# Patient Record
Sex: Female | Born: 1975 | Race: Black or African American | Hispanic: No | Marital: Single | Smoking: Current every day smoker
Health system: Southern US, Community
[De-identification: ages and names within clinical notes are randomized; demographics above are authoritative.]

## PROBLEM LIST (undated history)

## (undated) DIAGNOSIS — B999 Unspecified infectious disease: Secondary | ICD-10-CM

## (undated) DIAGNOSIS — K859 Acute pancreatitis without necrosis or infection, unspecified: Secondary | ICD-10-CM

## (undated) DIAGNOSIS — F329 Major depressive disorder, single episode, unspecified: Secondary | ICD-10-CM

## (undated) DIAGNOSIS — F32A Depression, unspecified: Secondary | ICD-10-CM

## (undated) DIAGNOSIS — A749 Chlamydial infection, unspecified: Secondary | ICD-10-CM

## (undated) HISTORY — PX: INDUCED ABORTION: SHX677

## (undated) HISTORY — PX: DILATION AND CURETTAGE OF UTERUS: SHX78

---

## 2004-05-13 ENCOUNTER — Emergency Department (HOSPITAL_COMMUNITY): Admission: EM | Admit: 2004-05-13 | Discharge: 2004-05-13 | Payer: Self-pay | Admitting: Emergency Medicine

## 2004-05-15 ENCOUNTER — Ambulatory Visit (HOSPITAL_COMMUNITY): Admission: RE | Admit: 2004-05-15 | Discharge: 2004-05-15 | Payer: Self-pay | Admitting: *Deleted

## 2004-07-30 ENCOUNTER — Ambulatory Visit (HOSPITAL_COMMUNITY): Admission: RE | Admit: 2004-07-30 | Discharge: 2004-07-30 | Payer: Self-pay | Admitting: *Deleted

## 2004-09-19 ENCOUNTER — Inpatient Hospital Stay (HOSPITAL_COMMUNITY): Admission: AD | Admit: 2004-09-19 | Discharge: 2004-09-19 | Payer: Self-pay | Admitting: *Deleted

## 2004-09-24 ENCOUNTER — Ambulatory Visit (HOSPITAL_COMMUNITY): Admission: RE | Admit: 2004-09-24 | Discharge: 2004-09-24 | Payer: Self-pay | Admitting: *Deleted

## 2004-09-27 ENCOUNTER — Ambulatory Visit: Payer: Self-pay | Admitting: Family Medicine

## 2004-09-27 ENCOUNTER — Inpatient Hospital Stay (HOSPITAL_COMMUNITY): Admission: AD | Admit: 2004-09-27 | Discharge: 2004-10-01 | Payer: Self-pay | Admitting: Obstetrics & Gynecology

## 2004-10-04 ENCOUNTER — Inpatient Hospital Stay (HOSPITAL_COMMUNITY): Admission: AD | Admit: 2004-10-04 | Discharge: 2004-10-04 | Payer: Self-pay | Admitting: Obstetrics and Gynecology

## 2004-10-27 ENCOUNTER — Ambulatory Visit: Payer: Self-pay | Admitting: Obstetrics & Gynecology

## 2005-09-13 ENCOUNTER — Emergency Department (HOSPITAL_COMMUNITY): Admission: EM | Admit: 2005-09-13 | Discharge: 2005-09-13 | Payer: Self-pay | Admitting: Emergency Medicine

## 2006-04-22 ENCOUNTER — Emergency Department (HOSPITAL_COMMUNITY): Admission: EM | Admit: 2006-04-22 | Discharge: 2006-04-22 | Payer: Self-pay

## 2013-03-30 ENCOUNTER — Emergency Department (HOSPITAL_COMMUNITY): Payer: Medicaid Other

## 2013-03-30 ENCOUNTER — Encounter (HOSPITAL_COMMUNITY): Payer: Self-pay | Admitting: Physical Medicine and Rehabilitation

## 2013-03-30 ENCOUNTER — Inpatient Hospital Stay (HOSPITAL_COMMUNITY)
Admission: EM | Admit: 2013-03-30 | Discharge: 2013-04-01 | DRG: 417 | Disposition: A | Discharge status: Home / Self Care | Payer: Medicaid Other | Attending: Internal Medicine | Admitting: Internal Medicine

## 2013-03-30 DIAGNOSIS — Z720 Tobacco use: Secondary | ICD-10-CM | POA: Diagnosis present

## 2013-03-30 DIAGNOSIS — K8000 Calculus of gallbladder with acute cholecystitis without obstruction: Principal | ICD-10-CM | POA: Diagnosis present

## 2013-03-30 DIAGNOSIS — E876 Hypokalemia: Secondary | ICD-10-CM | POA: Diagnosis not present

## 2013-03-30 DIAGNOSIS — F17200 Nicotine dependence, unspecified, uncomplicated: Secondary | ICD-10-CM | POA: Diagnosis present

## 2013-03-30 DIAGNOSIS — K859 Acute pancreatitis without necrosis or infection, unspecified: Secondary | ICD-10-CM

## 2013-03-30 DIAGNOSIS — Z6841 Body Mass Index (BMI) 40.0 and over, adult: Secondary | ICD-10-CM

## 2013-03-30 DIAGNOSIS — K8020 Calculus of gallbladder without cholecystitis without obstruction: Secondary | ICD-10-CM | POA: Diagnosis present

## 2013-03-30 DIAGNOSIS — Z8719 Personal history of other diseases of the digestive system: Secondary | ICD-10-CM | POA: Diagnosis present

## 2013-03-30 DIAGNOSIS — E669 Obesity, unspecified: Secondary | ICD-10-CM | POA: Diagnosis present

## 2013-03-30 HISTORY — DX: Acute pancreatitis without necrosis or infection, unspecified: K85.90

## 2013-03-30 LAB — CBC
Hemoglobin: 11.7 g/dL — ABNORMAL LOW (ref 12.0–15.0)
Platelets: 242 10*3/uL (ref 150–400)
RBC: 3.8 MIL/uL — ABNORMAL LOW (ref 3.87–5.11)

## 2013-03-30 LAB — CREATININE, SERUM
Creatinine, Ser: 0.62 mg/dL (ref 0.50–1.10)
GFR calc non Af Amer: >90 mL/min (ref >90)

## 2013-03-30 LAB — COMPREHENSIVE METABOLIC PANEL
AST: 18 U/L (ref 0–37)
BUN: 12 mg/dL (ref 6–23)
CO2: 23 mEq/L (ref 19–32)
Calcium: 8.9 mg/dL (ref 8.4–10.5)
Chloride: 104 mEq/L (ref 96–112)
Creatinine, Ser: 0.74 mg/dL (ref 0.50–1.10)
GFR calc Af Amer: >90 mL/min (ref >90)
GFR calc non Af Amer: >90 mL/min (ref >90)
Glucose, Bld: 92 mg/dL (ref 70–99)
Potassium: 3.9 mEq/L (ref 3.5–5.1)
Total Bilirubin: 0.2 mg/dL — ABNORMAL LOW (ref 0.3–1.2)

## 2013-03-30 LAB — URINALYSIS, ROUTINE W REFLEX MICROSCOPIC
Bilirubin Urine: NEGATIVE
Glucose, UA: NEGATIVE mg/dL
Ketones, ur: NEGATIVE mg/dL
Leukocytes, UA: NEGATIVE
Nitrite: NEGATIVE
Protein, ur: NEGATIVE mg/dL
Specific Gravity, Urine: 1.019 (ref 1.005–1.030)
Urobilinogen, UA: 0.2 mg/dL (ref 0.0–1.0)

## 2013-03-30 LAB — URINE MICROSCOPIC-ADD ON

## 2013-03-30 LAB — CBC WITH DIFFERENTIAL/PLATELET
Basophils Absolute: 0 10*3/uL (ref 0.0–0.1)
Basophils Relative: 0 % (ref 0–1)
Eosinophils Absolute: 0.1 10*3/uL (ref 0.0–0.7)
Eosinophils Relative: 1 % (ref 0–5)
HCT: 39 % (ref 36.0–46.0)
Hemoglobin: 13 g/dL (ref 12.0–15.0)
Lymphocytes Relative: 20 % (ref 12–46)
Lymphs Abs: 2 10*3/uL (ref 0.7–4.0)
MCH: 31.6 pg (ref 26.0–34.0)
MCHC: 33.3 g/dL (ref 30.0–36.0)
MCV: 94.9 fL (ref 78.0–100.0)
Monocytes Absolute: 0.4 10*3/uL (ref 0.1–1.0)
Monocytes Relative: 4 % (ref 3–12)
Neutro Abs: 7.4 10*3/uL (ref 1.7–7.7)
Neutrophils Relative %: 74 % (ref 43–77)
WBC: 10 10*3/uL (ref 4.0–10.5)

## 2013-03-30 LAB — PREGNANCY, URINE: Preg Test, Ur: NEGATIVE

## 2013-03-30 LAB — LIPASE, BLOOD: Lipase: 179 U/L — ABNORMAL HIGH (ref 11–59)

## 2013-03-30 LAB — POCT I-STAT TROPONIN I: Troponin i, poc: 0 ng/mL (ref 0.00–0.08)

## 2013-03-30 MED ORDER — ACETAMINOPHEN 650 MG RE SUPP: 650.0000 mg | Freq: Four times a day (QID) | RECTAL | Status: DC | PRN

## 2013-03-30 MED ORDER — MORPHINE SULFATE 4 MG/ML IJ SOLN
6.0000 mg | Freq: Once | INTRAMUSCULAR | Status: AC
  Administered 2013-03-30: 6 mg via INTRAVENOUS
  Filled 2013-03-30: qty 2

## 2013-03-30 MED ORDER — ACETAMINOPHEN 325 MG PO TABS: 650.0000 mg | ORAL_TABLET | Freq: Four times a day (QID) | ORAL | Status: DC | PRN

## 2013-03-30 MED ORDER — SODIUM CHLORIDE 0.9 % IV BOLUS (SEPSIS)
1000.0000 mL | Freq: Once | INTRAVENOUS | Status: AC
  Administered 2013-03-30: 1000 mL via INTRAVENOUS

## 2013-03-30 MED ORDER — SODIUM CHLORIDE 0.9 % IV SOLN
INTRAVENOUS | Status: DC
  Administered 2013-03-31 (×2): via INTRAVENOUS

## 2013-03-30 MED ORDER — SODIUM CHLORIDE 0.9 % IV SOLN
INTRAVENOUS | Status: DC
  Administered 2013-03-30: 18:00:00 via INTRAVENOUS

## 2013-03-30 MED ORDER — NICOTINE 7 MG/24HR TD PT24
7.0000 mg | MEDICATED_PATCH | Freq: Every day | TRANSDERMAL | Status: DC
  Filled 2013-03-30 (×3): qty 1

## 2013-03-30 MED ORDER — ONDANSETRON HCL 4 MG/2ML IJ SOLN
4.0000 mg | INTRAMUSCULAR | Status: DC
  Administered 2013-03-30: 4 mg via INTRAVENOUS
  Filled 2013-03-30: qty 2

## 2013-03-30 MED ORDER — PANTOPRAZOLE SODIUM 40 MG IV SOLR
40.0000 mg | INTRAVENOUS | Status: DC
  Administered 2013-03-30 – 2013-03-31 (×2): 40 mg via INTRAVENOUS
  Filled 2013-03-30 (×3): qty 40

## 2013-03-30 MED ORDER — ONDANSETRON HCL 4 MG/2ML IJ SOLN
4.0000 mg | Freq: Once | INTRAMUSCULAR | Status: AC
  Administered 2013-03-30: 4 mg via INTRAVENOUS
  Filled 2013-03-30: qty 2

## 2013-03-30 MED ORDER — SENNOSIDES-DOCUSATE SODIUM 8.6-50 MG PO TABS: 1.0000 | ORAL_TABLET | Freq: Every evening | ORAL | Status: DC | PRN

## 2013-03-30 MED ORDER — HYDROMORPHONE HCL PF 1 MG/ML IJ SOLN
1.0000 mg | INTRAMUSCULAR | Status: DC | PRN
  Administered 2013-03-30 – 2013-03-31 (×6): 2 mg via INTRAVENOUS
  Administered 2013-03-31 (×2): 1 mg via INTRAVENOUS
  Administered 2013-03-31: 2 mg via INTRAVENOUS
  Filled 2013-03-30 (×3): qty 2
  Filled 2013-03-30: qty 1
  Filled 2013-03-30 (×3): qty 2
  Filled 2013-03-30: qty 1
  Filled 2013-03-30: qty 2

## 2013-03-30 MED ORDER — PROMETHAZINE HCL 25 MG/ML IJ SOLN
12.5000 mg | Freq: Four times a day (QID) | INTRAMUSCULAR | Status: DC | PRN
  Administered 2013-03-31: 12.5 mg via INTRAVENOUS
  Filled 2013-03-30: qty 1

## 2013-03-30 MED ORDER — HYDROMORPHONE HCL PF 1 MG/ML IJ SOLN
1.0000 mg | INTRAMUSCULAR | Status: DC | PRN
  Administered 2013-03-30: 1 mg via INTRAVENOUS
  Filled 2013-03-30: qty 1

## 2013-03-30 MED ORDER — HEPARIN SODIUM (PORCINE) 5000 UNIT/ML IJ SOLN
5000.0000 [IU] | Freq: Three times a day (TID) | INTRAMUSCULAR | Status: DC
  Administered 2013-03-30 – 2013-04-01 (×5): 5000 [IU] via SUBCUTANEOUS
  Filled 2013-03-30 (×8): qty 1

## 2013-03-30 NOTE — ED Notes (Signed)
unable to urinate in triage; sts "I just went before I came back here"

## 2013-03-30 NOTE — H&P (Addendum)
[facility] History and Physical  Sarah Mueller ZOX:[id number] DOB: 1976/04/16 DOA: 03/30/2013  Referring physician: Rosalia Hammers PCP: No PCP Per Patient   Chief Complaint: Abdominal pain  HPI: Sarah Mueller is a 37 y.o. female who presents with sudden onset epigastric pain this morning. It is very severe. She had an episode of vomiting after taking TUMS this morning. In the emergency room, she was found to have an elevated lipase. Liver function tests were normal. Ultrasound was done and showed cholelithiasis but no cholecystitis or ductal dilatation. Patient reports similar, less severe pain in the past, but usually just rests and it resolves. She drinks alcohol but denies drinking to excess. She had a beer after work last night. No significant past medical history. No fevers or chills.  Review of Systems: Systems reviewed and as above otherwise negative.  Past medical history: None  Past surgical history: C-section  Social History: Patient smokes a pack every 3 days. She drinks a beer after work occasionally. Denies drug use. Here with her boyfriend.  No Known Allergies  Family history: Mother on multiple medications, but patient does not know what health problems she has. Father is healthy.  Prior to Admission medications   Medication Sig Start Date End Date Taking? Authorizing Provider  naproxen sodium (ANAPROX) 220 MG tablet Take 880 mg by mouth daily as needed (Pain).   Yes Historical Provider, MD   Physical Exam: Filed Vitals:   03/30/13 1445 03/30/13 1600 03/30/13 1630 03/30/13 1750  BP: 126/68 123/98 127/73 157/87  Pulse:  68 61 57  Temp:    97.8 F (36.6 C)  TempSrc:    Oral  Resp: 18   20  Height:    5\' 6"  (1.676 m)  Weight:    130.3 kg (287 lb 4.2 oz)  SpO2: 100% 100% 96% 100%   Body mass index is 46.39 kg/(m^2).  BP 157/87  Pulse 57  Temp(Src) 97.8 F (36.6 C) (Oral)  Resp 20  Ht 5\' 6"  (1.676 m)  Wt 130.3 kg (287 lb 4.2 oz)  BMI 46.39 kg/m2  SpO2 100%   LMP 03/30/2013  General Appearance:    uncomfortable appearing obese black female walking back from the bathroom, crying. Oriented.   Head:    Normocephalic, without obvious abnormality, atraumatic  Eyes:    PERRL, conjunctiva/corneas clear, EOM's intact, fundi    benign, both eyes     Nose:   Nares normal, septum midline, mucosa normal, no drainage    or sinus tenderness  Throat:   Lips, mucosa, and tongue normal; teeth and gums normal  Neck:   Supple, symmetrical, trachea midline, no adenopathy;    thyroid:  no enlargement/tenderness/nodules; no carotid   bruit or JVD  Back:     Symmetric, no curvature, ROM normal, no CVA tenderness  Lungs:     Clear to auscultation bilaterally, respirations unlabored  Chest Wall:    No tenderness or deformity   Heart:    Regular rate and rhythm, S1 and S2 normal, no murmur, rub   or gallop     Abdomen:     soft, epigastric and right upper quadrant is tender. obese  Genitalia:   deferred   Rectal:   deferred   Extremities:   Extremities normal, atraumatic, no cyanosis or edema  Pulses:   2+ and symmetric all extremities  Skin:   Skin color, texture, turgor normal, no rashes or lesions  Lymph nodes:   Cervical, supraclavicular, and axillary nodes normal  Neurologic:  CNII-XII intact, normal strength, sensation and reflexes    throughout    Labs on Admission:  Basic Metabolic Panel:  Recent Labs Lab 03/30/13 1239  NA 141  K 3.9  CL 104  CO2 23  GLUCOSE 92  BUN 12  CREATININE 0.74  CALCIUM 8.9   Liver Function Tests:  Recent Labs Lab 03/30/13 1239  AST 18  ALT 21  ALKPHOS 73  BILITOT 0.2*  PROT 7.1  ALBUMIN 3.5    Recent Labs Lab 03/30/13 1239  LIPASE 179*   No results found for this basename: AMMONIA,  in the last 168 hours CBC:  Recent Labs Lab 03/30/13 1239  WBC 10.0  NEUTROABS 7.4  HGB 13.0  HCT 39.0  MCV 94.9  PLT 286   Cardiac Enzymes: No results found for this basename: CKTOTAL, CKMB, CKMBINDEX,  TROPONINI,  in the last 168 hours  BNP (last 3 results) No results found for this basename: PROBNP,  in the last 8760 hours CBG: No results found for this basename: GLUCAP,  in the last 168 hours  Radiological Exams on Admission: Dg Chest 2 View  03/30/2013   *RADIOLOGY REPORT*  Clinical Data: Chest pain  CHEST - 2 VIEW  Comparison: None.  Findings: The lungs are well-aerated and free from pulmonary edema, focal airspace consolidation or pulmonary nodule.  Cardiac and mediastinal contours are within normal limits.  No pneumothorax, or pleural effusion. No acute osseous findings.  IMPRESSION:  No acute cardiopulmonary disease.   Original Report Authenticated By: Malachy Moan, M.D.   US Abdomen Complete  03/30/2013   *RADIOLOGY REPORT*  Clinical Data:  Abdominal pain.  ABDOMEN ULTRASOUND  Technique:  Complete abdominal ultrasound examination was performed including evaluation of the liver, gallbladder, bile ducts, pancreas, kidneys, spleen, IVC, and abdominal aorta.  Comparison:  None  Findings:  Gallbladder:  There is a dominant, shadowing gallstone located in the neck of the gallbladder and likely measuring over 3 cm in greatest diameter.  There is suggestion of also potentially some sludge and small additional calculi in the gallbladder.  There is no evidence of overt gallbladder wall thickening or sonographic Murphy's sign.  Common Bile Duct:  Normal caliber with measured diameter of 4 mm.  Liver:  No focal mass lesion seen.  Within normal limits in parenchymal echogenicity.  IVC:  Patent throughout its visualized course in the abdomen.  Pancreas:  Although the pancreas is difficult to visualize in its entirety, no focal pancreatic abnormality is identified.  Spleen:  The spleen is of normal echotexture and size.  Kidneys:  The right kidney measures 11.8 cm and the left kidney 10.3 cm.  Both have a normal sonographic appearance.  Abdominal Aorta:  Visualized abdominal aorta is of normal caliber.  The distal aorta is not visualized.  IMPRESSION: Cholelithiasis with a large dominant shadowing gallstone identified in the neck of the gallbladder.  No overt associated evidence by ultrasound of cholecystitis or biliary obstruction.   Original Report Authenticated By: Irish Lack, M.D.   Assessment/Plan Principal Problem:   Pancreatitis, acute: Patient denies heavy alcohol use. LFTs are normal, but could be biliary in origin. Will keep patient n.p.o., give IV fluids, pain medication, anti-emetics. Will follow lipase and liver function tests. Should patient is pain fail to improve within the next day or 2, or should her liver function tests rise, may require MRCP or other workup. We'll likely require elective laparoscopic cholecystectomy once pancreatitis resolves.    Obesity, unspecified    Cholelithiasis: See  above. Ultrasound is without signs of cholecystitis or ductal dilatation.    Tobacco abuse: Nicotine patch.  Code Status: Full Family Communication: Discussed with patient's significant other with patient's permission Disposition Plan: Eventually home Time spent: 50 minutes  Jeree Delcid L [facility] Pager [phone]  If 7PM-7AM, please contact night-coverage [email or web address] Password [facility] 03/30/2013, 6:13 PM

## 2013-03-30 NOTE — ED Provider Notes (Signed)
History     CSN: [id number]  Arrival date & time 03/30/13  1230   First MD Initiated Contact with Patient 03/30/13 1346      Chief Complaint  Patient presents with  . Abdominal Pain    (Consider location/radiation/quality/duration/timing/severity/associated sxs/prior treatment) HPI  Sarah Mueller is a 37 y.o. female complaining of acute onset of epigastric pain onset this a.m. rated at 8/10, described as gnawing patient took a TUMS and proceeded to get nauseous and had single episode of nonbloody, nonbilious, no coffee ground emesis. After the emesis patient describes at chest pain. Patient denies fever, shortness of breath, change in bowel or bladder habits.  No primary care physician  No past medical history on file.  No past surgical history on file.  No family history on file.  History  Substance Use Topics  . Smoking status: Current Every Day Smoker    Types: Cigarettes  . Smokeless tobacco: Not on file  . Alcohol Use: Yes     Comment: social    OB History   Grav Para Term Preterm Abortions TAB SAB Ect Mult Living                  Review of Systems  Constitutional: Negative for fever.  Respiratory: Negative for shortness of breath.   Cardiovascular: Negative for chest pain.  Gastrointestinal: Positive for nausea, vomiting and abdominal pain. Negative for diarrhea.  All other systems reviewed and are negative.    Allergies  Review of patient's allergies indicates no known allergies.  Home Medications   Current Outpatient Rx  Name  Route  Sig  Dispense  Refill  . naproxen sodium (ANAPROX) 220 MG tablet   Oral   Take 880 mg by mouth daily as needed (Pain).           BP 159/93  Pulse 62  Temp(Src) 98.6 F (37 C) (Oral)  Resp 18  SpO2 100%  Physical Exam  Nursing note and vitals reviewed. Constitutional: She is oriented to person, place, and time. She appears well-developed and well-nourished. No distress.  HENT:  Head: Normocephalic.   Mouth/Throat: Oropharynx is clear and moist.  Eyes: Conjunctivae and EOM are normal.  Cardiovascular: Normal rate.   Pulmonary/Chest: Effort normal. No stridor.  Abdominal: Soft. Bowel sounds are normal. She exhibits no distension and no mass. There is tenderness. There is no rebound and no guarding.  Positive Murphy sign  Musculoskeletal: Normal range of motion.  Neurological: She is alert and oriented to person, place, and time.  Psychiatric: She has a normal mood and affect.    ED Course  Procedures (including critical care time)  Labs Reviewed  COMPREHENSIVE METABOLIC PANEL - Abnormal; Notable for the following:    Total Bilirubin 0.2 (*)    All other components within normal limits  LIPASE, BLOOD - Abnormal; Notable for the following:    Lipase 179 (*)    All other components within normal limits  URINALYSIS, ROUTINE W REFLEX MICROSCOPIC - Abnormal; Notable for the following:    APPearance CLOUDY (*)    Hgb urine dipstick SMALL (*)    All other components within normal limits  CBC WITH DIFFERENTIAL  URINE MICROSCOPIC-ADD ON  PREGNANCY, URINE  POCT I-STAT TROPONIN I   Dg Chest 2 View  03/30/2013   *RADIOLOGY REPORT*  Clinical Data: Chest pain  CHEST - 2 VIEW  Comparison: None.  Findings: The lungs are well-aerated and free from pulmonary edema, focal airspace consolidation or pulmonary nodule.  Cardiac and mediastinal contours are within normal limits.  No pneumothorax, or pleural effusion. No acute osseous findings.  IMPRESSION:  No acute cardiopulmonary disease.   Original Report Authenticated By: Malachy Moan, M.D.   US Abdomen Complete  03/30/2013   *RADIOLOGY REPORT*  Clinical Data:  Abdominal pain.  ABDOMEN ULTRASOUND  Technique:  Complete abdominal ultrasound examination was performed including evaluation of the liver, gallbladder, bile ducts, pancreas, kidneys, spleen, IVC, and abdominal aorta.  Comparison:  None  Findings:  Gallbladder:  There is a dominant,  shadowing gallstone located in the neck of the gallbladder and likely measuring over 3 cm in greatest diameter.  There is suggestion of also potentially some sludge and small additional calculi in the gallbladder.  There is no evidence of overt gallbladder wall thickening or sonographic Murphy's sign.  Common Bile Duct:  Normal caliber with measured diameter of 4 mm.  Liver:  No focal mass lesion seen.  Within normal limits in parenchymal echogenicity.  IVC:  Patent throughout its visualized course in the abdomen.  Pancreas:  Although the pancreas is difficult to visualize in its entirety, no focal pancreatic abnormality is identified.  Spleen:  The spleen is of normal echotexture and size.  Kidneys:  The right kidney measures 11.8 cm and the left kidney 10.3 cm.  Both have a normal sonographic appearance.  Abdominal Aorta:  Visualized abdominal aorta is of normal caliber. The distal aorta is not visualized.  IMPRESSION: Cholelithiasis with a large dominant shadowing gallstone identified in the neck of the gallbladder.  No overt associated evidence by ultrasound of cholecystitis or biliary obstruction.   Original Report Authenticated By: Irish Lack, M.D.   3:53 PM patient states the morphine initially helped the pain but it has now come back and is quite severe. She's had no episodes of vomiting she is requesting ice chips because her mouth is dry. I told her she can have ice to that she must not swallow any water as it will significantly exacerbate the pain. We have discussed results and the fact that she will require admission for pancreatitis.   Date: 03/30/2013  Rate: 53  Rhythm: Sinus bradycardia  QRS Axis: normal  Intervals: normal  ST/T Wave abnormalities: normal  Conduction Disutrbances:none  Narrative Interpretation:   Old EKG Reviewed: None available    1. Pancreatitis   2. Gallstones       MDM   Filed Vitals:   03/30/13 1235 03/30/13 1400 03/30/13 1443 03/30/13 1445  BP:  159/93 143/77 126/68 126/68  Pulse: 62 52 62   Temp: 98.6 F (37 C)     TempSrc: Oral     Resp: 18  20 18   SpO2: 100% 100% 96% 100%     Sarah Mueller is a 37 y.o. female with epigastric abdominal pain, vomiting. Patient has a positive Murphy sign with no guarding or rebound. Patient is afebrile does not appear septic. Patient also endorses a substernal chest pain described as burning. EKG is nonischemic, troponin  and chest x-ray is negative.   Lipase is elevated at 179. Ultrasound shows cholelithiasis with a large shadowing stone in the neck of the gallbladder with no evidence of overt cholecystitis or obstruction.  Unassigned admission to MedSurg bed under the care of Dr. Lendell Caprice Team 9. Patient will be admitted to inpatient not observation at the request of the admitting physician.  Medications  HYDROmorphone (DILAUDID) injection 1 mg (not administered)  ondansetron (ZOFRAN) injection 4 mg (4 mg Intravenous Given 03/30/13 1556)  morphine 4 MG/ML injection 6 mg (6 mg Intravenous Given 03/30/13 1433)  ondansetron (ZOFRAN) injection 4 mg (4 mg Intravenous Given 03/30/13 1428)  sodium chloride 0.9 % bolus 1,000 mL (1,000 mLs Intravenous New Bag/Given 03/30/13 1549)           Wynetta Emery, PA-C 03/30/13 1623

## 2013-03-30 NOTE — ED Provider Notes (Signed)
History/physical exam/procedure(s) were performed by non-physician practitioner and as supervising physician I was immediately available for consultation/collaboration. I have reviewed all notes and am in agreement with care and plan.   Lexiana Spindel S Elmo Shumard, MD 03/30/13 1723 

## 2013-03-30 NOTE — ED Notes (Signed)
Pt presents to department for evaluation of abdominal pain and frequent belching. States no relief with TUMS this morning. States burning sensation to abdomen. Also states nausea/vomiting. She is conscious alert and oriented x4.

## 2013-03-31 ENCOUNTER — Inpatient Hospital Stay (HOSPITAL_COMMUNITY): Payer: Medicaid Other

## 2013-03-31 ENCOUNTER — Encounter (HOSPITAL_COMMUNITY): Payer: Self-pay | Admitting: Anesthesiology

## 2013-03-31 ENCOUNTER — Encounter (HOSPITAL_COMMUNITY)
Admission: EM | Disposition: A | Discharge status: Home / Self Care | Payer: Self-pay | Source: Home / Self Care | Attending: Internal Medicine

## 2013-03-31 ENCOUNTER — Inpatient Hospital Stay (HOSPITAL_COMMUNITY): Payer: Medicaid Other | Admitting: Anesthesiology

## 2013-03-31 DIAGNOSIS — K8000 Calculus of gallbladder with acute cholecystitis without obstruction: Secondary | ICD-10-CM

## 2013-03-31 HISTORY — PX: CHOLECYSTECTOMY: SHX55

## 2013-03-31 LAB — COMPREHENSIVE METABOLIC PANEL
ALT: 18 U/L (ref 0–35)
Albumin: 3.1 g/dL — ABNORMAL LOW (ref 3.5–5.2)
Alkaline Phosphatase: 62 U/L (ref 39–117)
BUN: 7 mg/dL (ref 6–23)
Calcium: 8.5 mg/dL (ref 8.4–10.5)
Potassium: 3.8 mEq/L (ref 3.5–5.1)
Sodium: 140 mEq/L (ref 135–145)
Total Protein: 6.4 g/dL (ref 6.0–8.3)

## 2013-03-31 LAB — SURGICAL PCR SCREEN: Staphylococcus aureus: NEGATIVE

## 2013-03-31 LAB — LIPASE, BLOOD: Lipase: 15 U/L (ref 11–59)

## 2013-03-31 SURGERY — LAPAROSCOPIC CHOLECYSTECTOMY WITH INTRAOPERATIVE CHOLANGIOGRAM
Anesthesia: General | Site: Abdomen | Wound class: Clean Contaminated

## 2013-03-31 MED ORDER — HYDROMORPHONE HCL PF 1 MG/ML IJ SOLN
INTRAMUSCULAR | Status: AC
  Filled 2013-03-31: qty 1

## 2013-03-31 MED ORDER — NEOSTIGMINE METHYLSULFATE 1 MG/ML IJ SOLN
INTRAMUSCULAR | Status: DC | PRN
  Administered 2013-03-31: 4 mg via INTRAVENOUS

## 2013-03-31 MED ORDER — BUPIVACAINE HCL (PF) 0.25 % IJ SOLN
INTRAMUSCULAR | Status: AC
  Filled 2013-03-31: qty 30

## 2013-03-31 MED ORDER — HYDROMORPHONE HCL PF 1 MG/ML IJ SOLN: 1.0000 mg | INTRAMUSCULAR | Status: DC | PRN

## 2013-03-31 MED ORDER — IOHEXOL 300 MG/ML  SOLN
INTRAMUSCULAR | Status: DC | PRN
  Administered 2013-03-31: 50 mL via INTRAVENOUS

## 2013-03-31 MED ORDER — CEFAZOLIN SODIUM 1 G IJ SOLR
2.0000 g | INTRAMUSCULAR | Status: DC
  Filled 2013-03-31: qty 20

## 2013-03-31 MED ORDER — FENTANYL CITRATE 0.05 MG/ML IJ SOLN
INTRAMUSCULAR | Status: DC | PRN
  Administered 2013-03-31: 50 ug via INTRAVENOUS
  Administered 2013-03-31: 100 ug via INTRAVENOUS
  Administered 2013-03-31: 50 ug via INTRAVENOUS

## 2013-03-31 MED ORDER — OXYCODONE HCL 5 MG/5ML PO SOLN: 5.0000 mg | Freq: Once | ORAL | Status: DC | PRN

## 2013-03-31 MED ORDER — PROPOFOL 10 MG/ML IV BOLUS
INTRAVENOUS | Status: DC | PRN
  Administered 2013-03-31: 200 mg via INTRAVENOUS

## 2013-03-31 MED ORDER — ONDANSETRON HCL 4 MG/2ML IJ SOLN: 4.0000 mg | Freq: Four times a day (QID) | INTRAMUSCULAR | Status: DC | PRN

## 2013-03-31 MED ORDER — ARTIFICIAL TEARS OP OINT
TOPICAL_OINTMENT | OPHTHALMIC | Status: DC | PRN
  Administered 2013-03-31: 1 via OPHTHALMIC

## 2013-03-31 MED ORDER — SODIUM CHLORIDE 0.9 % IR SOLN
Status: DC | PRN
  Administered 2013-03-31: 1000 mL

## 2013-03-31 MED ORDER — ROCURONIUM BROMIDE 100 MG/10ML IV SOLN
INTRAVENOUS | Status: DC | PRN
  Administered 2013-03-31: 40 mg via INTRAVENOUS

## 2013-03-31 MED ORDER — ONDANSETRON HCL 4 MG/2ML IJ SOLN
INTRAMUSCULAR | Status: DC | PRN
  Administered 2013-03-31: 4 mg via INTRAVENOUS

## 2013-03-31 MED ORDER — 0.9 % SODIUM CHLORIDE (POUR BTL) OPTIME
TOPICAL | Status: DC | PRN
  Administered 2013-03-31: 1000 mL

## 2013-03-31 MED ORDER — OXYCODONE-ACETAMINOPHEN 5-325 MG PO TABS
1.0000 | ORAL_TABLET | ORAL | Status: DC | PRN
  Administered 2013-04-01 (×2): 2 via ORAL
  Filled 2013-03-31 (×2): qty 2

## 2013-03-31 MED ORDER — SUCCINYLCHOLINE CHLORIDE 20 MG/ML IJ SOLN
INTRAMUSCULAR | Status: DC | PRN
  Administered 2013-03-31: 120 mg via INTRAVENOUS

## 2013-03-31 MED ORDER — LACTATED RINGERS IV SOLN
INTRAVENOUS | Status: DC | PRN
  Administered 2013-03-31 (×2): via INTRAVENOUS

## 2013-03-31 MED ORDER — DEXTROSE 5 % IV SOLN
INTRAVENOUS | Status: DC | PRN
  Administered 2013-03-31: 15:00:00 via INTRAVENOUS

## 2013-03-31 MED ORDER — LIDOCAINE HCL (CARDIAC) 20 MG/ML IV SOLN
INTRAVENOUS | Status: DC | PRN
  Administered 2013-03-31: 100 mg via INTRAVENOUS

## 2013-03-31 MED ORDER — CEFAZOLIN SODIUM 1-5 GM-% IV SOLN
INTRAVENOUS | Status: DC | PRN
  Administered 2013-03-31: 2 g via INTRAVENOUS

## 2013-03-31 MED ORDER — HYDROMORPHONE HCL PF 1 MG/ML IJ SOLN
0.2500 mg | INTRAMUSCULAR | Status: DC | PRN
  Administered 2013-03-31 (×3): 0.5 mg via INTRAVENOUS

## 2013-03-31 MED ORDER — BUPIVACAINE HCL 0.25 % IJ SOLN
INTRAMUSCULAR | Status: DC | PRN
  Administered 2013-03-31: 30 mL

## 2013-03-31 MED ORDER — CEFAZOLIN SODIUM 1-5 GM-% IV SOLN
INTRAVENOUS | Status: AC
  Filled 2013-03-31: qty 100

## 2013-03-31 MED ORDER — ONDANSETRON HCL 4 MG/2ML IJ SOLN
4.0000 mg | Freq: Four times a day (QID) | INTRAMUSCULAR | Status: DC | PRN
  Filled 2013-03-31: qty 2

## 2013-03-31 MED ORDER — MIDAZOLAM HCL 5 MG/5ML IJ SOLN
INTRAMUSCULAR | Status: DC | PRN
  Administered 2013-03-31: 1 mg via INTRAVENOUS

## 2013-03-31 MED ORDER — OXYCODONE HCL 5 MG PO TABS: 5.0000 mg | ORAL_TABLET | Freq: Once | ORAL | Status: DC | PRN

## 2013-03-31 MED ORDER — ONDANSETRON HCL 4 MG PO TABS
4.0000 mg | ORAL_TABLET | Freq: Four times a day (QID) | ORAL | Status: DC | PRN
  Filled 2013-03-31: qty 1

## 2013-03-31 MED ORDER — LACTATED RINGERS IV SOLN
INTRAVENOUS | Status: DC
  Administered 2013-03-31 (×2): via INTRAVENOUS

## 2013-03-31 MED ORDER — GLYCOPYRROLATE 0.2 MG/ML IJ SOLN
INTRAMUSCULAR | Status: DC | PRN
  Administered 2013-03-31: 0.6 mg via INTRAVENOUS

## 2013-03-31 MED ORDER — CEFAZOLIN SODIUM-DEXTROSE 2-3 GM-% IV SOLR
2.0000 g | INTRAVENOUS | Status: DC
  Filled 2013-03-31: qty 50

## 2013-03-31 SURGICAL SUPPLY — 52 items
APL SKNCLS STERI-STRIP NONHPOA (GAUZE/BANDAGES/DRESSINGS) ×1
APPLIER CLIP 5 13 M/L LIGAMAX5 (MISCELLANEOUS) ×2
APR CLP MED LRG 5 ANG JAW (MISCELLANEOUS) ×1
BAG SPEC RTRVL LRG 6X4 10 (ENDOMECHANICALS)
BENZOIN TINCTURE PRP APPL 2/3 (GAUZE/BANDAGES/DRESSINGS) ×2 IMPLANT
CANISTER SUCTION 2500CC (MISCELLANEOUS) ×2 IMPLANT
CHLORAPREP W/TINT 26ML (MISCELLANEOUS) ×2 IMPLANT
CLIP APPLIE 5 13 M/L LIGAMAX5 (MISCELLANEOUS) ×1 IMPLANT
CLOTH BEACON ORANGE TIMEOUT ST (SAFETY) ×2 IMPLANT
CLSR STERI-STRIP ANTIMIC 1/2X4 (GAUZE/BANDAGES/DRESSINGS) ×1 IMPLANT
COVER MAYO STAND STRL (DRAPES) ×2 IMPLANT
COVER SURGICAL LIGHT HANDLE (MISCELLANEOUS) ×2 IMPLANT
COVER TRANSDUCER ULTRASND (DRAPES) IMPLANT
DEVICE TROCAR PUNCTURE CLOSURE (ENDOMECHANICALS) ×2 IMPLANT
DRAPE C-ARM 42X72 X-RAY (DRAPES) ×2 IMPLANT
DRAPE UTILITY 15X26 W/TAPE STR (DRAPE) ×4 IMPLANT
ELECT REM PT RETURN 9FT ADLT (ELECTROSURGICAL) ×2
ELECTRODE REM PT RTRN 9FT ADLT (ELECTROSURGICAL) ×1 IMPLANT
ENDOLOOP SUT PDS II  0 18 (SUTURE) ×1
ENDOLOOP SUT PDS II 0 18 (SUTURE) IMPLANT
GAUZE SPONGE 2X2 8PLY STRL LF (GAUZE/BANDAGES/DRESSINGS) ×1 IMPLANT
GLOVE BIO SURGEON STRL SZ 6 (GLOVE) ×1 IMPLANT
GLOVE BIO SURGEON STRL SZ7.5 (GLOVE) ×3 IMPLANT
GLOVE BIOGEL PI IND STRL 6 (GLOVE) IMPLANT
GLOVE BIOGEL PI IND STRL 7.5 (GLOVE) IMPLANT
GLOVE BIOGEL PI INDICATOR 6 (GLOVE) ×1
GLOVE BIOGEL PI INDICATOR 7.5 (GLOVE) ×1
GOWN STRL NON-REIN LRG LVL3 (GOWN DISPOSABLE) ×6 IMPLANT
GOWN STRL REIN XL XLG (GOWN DISPOSABLE) ×2 IMPLANT
IV CATH 14GX2 1/4 (CATHETERS) ×2 IMPLANT
KIT BASIN OR (CUSTOM PROCEDURE TRAY) ×2 IMPLANT
KIT ROOM TURNOVER OR (KITS) ×2 IMPLANT
NDL INSUFFLATION 14GA 120MM (NEEDLE) ×1 IMPLANT
NEEDLE INSUFFLATION 14GA 120MM (NEEDLE) ×2 IMPLANT
NS IRRIG 1000ML POUR BTL (IV SOLUTION) ×2 IMPLANT
PAD ARMBOARD 7.5X6 YLW CONV (MISCELLANEOUS) ×4 IMPLANT
POUCH SPECIMEN RETRIEVAL 10MM (ENDOMECHANICALS) IMPLANT
SCISSORS LAP 5X35 DISP (ENDOMECHANICALS) ×2 IMPLANT
SET CHOLANGIOGRAPHY FRANKLIN (SET/KITS/TRAYS/PACK) ×2 IMPLANT
SET IRRIG TUBING LAPAROSCOPIC (IRRIGATION / IRRIGATOR) ×2 IMPLANT
SLEEVE ENDOPATH XCEL 5M (ENDOMECHANICALS) ×2 IMPLANT
SPECIMEN JAR SMALL (MISCELLANEOUS) ×2 IMPLANT
SPONGE GAUZE 2X2 STER 10/PKG (GAUZE/BANDAGES/DRESSINGS) ×1
SUT MNCRL AB 3-0 PS2 18 (SUTURE) ×2 IMPLANT
SUT MNCRL AB 4-0 PS2 18 (SUTURE) ×1 IMPLANT
SUT VICRYL 0 UR6 27IN ABS (SUTURE) ×1 IMPLANT
TAPE CLOTH SURG 4X10 WHT LF (GAUZE/BANDAGES/DRESSINGS) ×1 IMPLANT
TOWEL OR 17X24 6PK STRL BLUE (TOWEL DISPOSABLE) ×2 IMPLANT
TOWEL OR 17X26 10 PK STRL BLUE (TOWEL DISPOSABLE) ×2 IMPLANT
TRAY LAPAROSCOPIC (CUSTOM PROCEDURE TRAY) ×2 IMPLANT
TROCAR XCEL NON-BLD 11X100MML (ENDOMECHANICALS) ×2 IMPLANT
TROCAR XCEL NON-BLD 5MMX100MML (ENDOMECHANICALS) ×2 IMPLANT

## 2013-03-31 NOTE — Progress Notes (Signed)
Dr. Chaney Malling called for a sign out

## 2013-03-31 NOTE — Progress Notes (Addendum)
[facility] PROGRESS NOTE  Dwight Burdo ZOX:[id number] DOB: [birth year] DOA: 03/30/2013 PCP: No PCP Per Patient  Assessment/Plan: Gallstone Pancreatitis, acute:  -Patient denies heavy alcohol use. Even though LFTs are normal,gallstones most likely etiology -lipase has normalized, LFTs nl, I have consulted surgery for possible lap chole -Will keep patient n.p.o., continue IV fluids, pain medication, anti-emetics. Obesity, unspecified  Cholelithiasis: See above. Ultrasound is without signs of cholecystitis or ductal dilatation. - consulted surgery as above for possible lap chole Tobacco abuse: Nicotine patch.   Code Status: full Family Communication: mother and significant other at bedside Disposition Plan: to home when medically ready   Consultants:  Surgery  Procedures:    Antibiotics:  none  HPI/Subjective: States decreased pain  Objective: Filed Vitals:   03/30/13 1630 03/30/13 1750 03/30/13 2219 03/31/13 0546  BP: 127/73 157/87 140/85 124/65  Pulse: 61 57 67 87  Temp:  97.8 F (36.6 C) 97.9 F (36.6 C) 98.2 F (36.8 C)  TempSrc:  Oral Oral Oral  Resp:  20 18 18   Height:  5\' 6"  (1.676 m)    Weight:  130.3 kg (287 lb 4.2 oz)    SpO2: 96% 100% 99% 99%    Intake/Output Summary (Last 24 hours) at 03/31/13 1215 Last data filed at 03/31/13 1040  Gross per 24 hour  Intake 2316.67 ml  Output      0 ml  Net 2316.67 ml   Filed Weights   03/30/13 1750  Weight: 130.3 kg (287 lb 4.2 oz)    Exam:   General:  Alert and oriented x3  Cardiovascular: RRR  Respiratory: CTAB  Abdomen: soft, epigastric tenderness present, ND, no masses palpable  Extremities: No cyanosis and no edema   Data Reviewed: Basic Metabolic Panel:  Recent Labs Lab 03/30/13 1239 03/30/13 1920 03/31/13 0457  NA 141  --  140  K 3.9  --  3.8  CL 104  --  106  CO2 23  --  28  GLUCOSE 92  --  109*  BUN 12  --  7  CREATININE 0.74 0.62 0.75  CALCIUM 8.9  --  8.5   Liver  Function Tests:  Recent Labs Lab 03/30/13 1239 03/31/13 0457  AST 18 13  ALT 21 18  ALKPHOS 73 62  BILITOT 0.2* 0.4  PROT 7.1 6.4  ALBUMIN 3.5 3.1*    Recent Labs Lab 03/30/13 1239 03/31/13 0457  LIPASE 179* 15   No results found for this basename: AMMONIA,  in the last 168 hours CBC:  Recent Labs Lab 03/30/13 1239 03/30/13 1920  WBC 10.0 12.5*  NEUTROABS 7.4  --   HGB 13.0 11.7*  HCT 39.0 35.3*  MCV 94.9 92.9  PLT 286 242   Cardiac Enzymes: No results found for this basename: CKTOTAL, CKMB, CKMBINDEX, TROPONINI,  in the last 168 hours BNP (last 3 results) No results found for this basename: PROBNP,  in the last 8760 hours CBG: No results found for this basename: GLUCAP,  in the last 168 hours  No results found for this or any previous visit (from the past 240 hour(s)).   Studies: Dg Chest 2 View  03/30/2013   *RADIOLOGY REPORT*  Clinical Data: Chest pain  CHEST - 2 VIEW  Comparison: None.  Findings: The lungs are well-aerated and free from pulmonary edema, focal airspace consolidation or pulmonary nodule.  Cardiac and mediastinal contours are within normal limits.  No pneumothorax, or pleural effusion. No acute osseous findings.  IMPRESSION:  No acute  cardiopulmonary disease.   Original Report Authenticated By: Malachy Moan, M.D.   US Abdomen Complete  03/30/2013   *RADIOLOGY REPORT*  Clinical Data:  Abdominal pain.  ABDOMEN ULTRASOUND  Technique:  Complete abdominal ultrasound examination was performed including evaluation of the liver, gallbladder, bile ducts, pancreas, kidneys, spleen, IVC, and abdominal aorta.  Comparison:  None  Findings:  Gallbladder:  There is a dominant, shadowing gallstone located in the neck of the gallbladder and likely measuring over 3 cm in greatest diameter.  There is suggestion of also potentially some sludge and small additional calculi in the gallbladder.  There is no evidence of overt gallbladder wall thickening or sonographic  Murphy's sign.  Common Bile Duct:  Normal caliber with measured diameter of 4 mm.  Liver:  No focal mass lesion seen.  Within normal limits in parenchymal echogenicity.  IVC:  Patent throughout its visualized course in the abdomen.  Pancreas:  Although the pancreas is difficult to visualize in its entirety, no focal pancreatic abnormality is identified.  Spleen:  The spleen is of normal echotexture and size.  Kidneys:  The right kidney measures 11.8 cm and the left kidney 10.3 cm.  Both have a normal sonographic appearance.  Abdominal Aorta:  Visualized abdominal aorta is of normal caliber. The distal aorta is not visualized.  IMPRESSION: Cholelithiasis with a large dominant shadowing gallstone identified in the neck of the gallbladder.  No overt associated evidence by ultrasound of cholecystitis or biliary obstruction.   Original Report Authenticated By: Irish Lack, M.D.    Scheduled Meds: . heparin  5,000 Units Subcutaneous Q8H  . nicotine  7 mg Transdermal Daily  . pantoprazole (PROTONIX) IV  40 mg Intravenous Q24H   Continuous Infusions: . sodium chloride 125 mL/hr at 03/31/13 1025    Principal Problem:   Pancreatitis, acute Active Problems:   Obesity, unspecified   Cholelithiasis   Tobacco abuse    Time spent: 25    [facility]  [facility] Pager [phone]. If 7PM-7AM, please contact night-coverage at [email or web address], password [facility] 03/31/2013, 12:15 PM  LOS: 1 day

## 2013-03-31 NOTE — Progress Notes (Signed)
Brief Nutrition Note:   RD pulled to chart for malnutrition screening tool indicating unintentional weight loss.   Pt states she has lost "about 20 lbs" in several months, related not eating while working and being on her feet all day. Pt states she wanted to lose weight, but was not necessarily trying. Weight loss is not significant in given time frame.  Currently NPO with acute pancreatitis. If pt to remain NPO for >/= 7 days, recommend initiation of nutrition support.  Wt Readings from Last 5 Encounters:  03/30/13 287 lb 4.2 oz (130.3 kg)  Body mass index is 46.39 kg/(m^2). obesity class 3, extreme.  Some weight loss would be beneficial for this pt.   No nutrition interventions warranted at this time. Please consult as needed.   Clarene Duke RD, LDN Pager [phone] After Hours pager [phone]

## 2013-03-31 NOTE — Progress Notes (Signed)
Attempted to void with no sucess

## 2013-03-31 NOTE — Preoperative (Signed)
Beta Blockers   Reason not to administer Beta Blockers:Not Applicable 

## 2013-03-31 NOTE — Anesthesia Procedure Notes (Signed)
Procedure Name: Intubation Date/Time: 03/31/2013 2:36 PM Performed by: Darcey Nora B Pre-anesthesia Checklist: Patient identified, Emergency Drugs available, Suction available and Patient being monitored Patient Re-evaluated:Patient Re-evaluated prior to inductionOxygen Delivery Method: Circle system utilized Preoxygenation: Pre-oxygenation with 100% oxygen Intubation Type: IV induction, Rapid sequence and Cricoid Pressure applied Ventilation: Mask ventilation without difficulty Laryngoscope Size: Mac and 3 Grade View: Grade II Tube type: Oral Tube size: 7.5 mm Number of attempts: 1 (very long jaw) Airway Equipment and Method: Stylet Secured at: 21 (cm at teeth) cm Tube secured with: Tape Dental Injury: Teeth and Oropharynx as per pre-operative assessment

## 2013-03-31 NOTE — Transfer of Care (Signed)
Immediate Anesthesia Transfer of Care Note  Patient: Sarah Mueller  Procedure(s) Performed: Procedure(s): LAPAROSCOPIC CHOLECYSTECTOMY WITH INTRAOPERATIVE CHOLANGIOGRAM (N/A)  Patient Location: PACU  Anesthesia Type:General  Level of Consciousness: awake, alert  and patient cooperative  Airway & Oxygen Therapy: Patient Spontanous Breathing and Patient connected to nasal cannula oxygen  Post-op Assessment: Report given to PACU RN, Post -op Vital signs reviewed and stable, Patient moving all extremities and Patient moving all extremities X 4  Post vital signs: Reviewed and stable  Complications: No apparent anesthesia complications

## 2013-03-31 NOTE — Anesthesia Preprocedure Evaluation (Addendum)
Anesthesia Evaluation  Patient identified by MRN, date of birth, ID band Patient awake    Reviewed: Allergy & Precautions, H&P , NPO status , Patient's Chart, lab work & pertinent test results, reviewed documented beta blocker date and time   History of Anesthesia Complications Negative for: history of anesthetic complications  Airway Mallampati: I TM Distance: >3 FB Neck ROM: Full    Dental  (+) Teeth Intact and Dental Advisory Given   Pulmonary neg pulmonary ROS, Current Smoker,  breath sounds clear to auscultation        Cardiovascular negative cardio ROS  Rhythm:Regular Rate:Normal     Neuro/Psych negative neurological ROS     GI/Hepatic negative GI ROS, Neg liver ROS,   Endo/Other  negative endocrine ROSMorbid obesity  Renal/GU negative Renal ROS     Musculoskeletal   Abdominal   Peds  Hematology negative hematology ROS (+)   Anesthesia Other Findings Plaque on lower gums/teeth.  Long jaw.  Excellent ROM neck and jaw.  Reproductive/Obstetrics                          Anesthesia Physical Anesthesia Plan  ASA: II  Anesthesia Plan: General   Post-op Pain Management:    Induction: Intravenous  Airway Management Planned:   Additional Equipment:   Intra-op Plan:   Post-operative Plan: Extubation in OR  Informed Consent:   Dental advisory given  Plan Discussed with: CRNA and Surgeon  Anesthesia Plan Comments:         Anesthesia Quick Evaluation

## 2013-03-31 NOTE — Consult Note (Signed)
Reason for Consult:Biliary pancreatitis Referring Physician: Dr. Koren Sarah Mueller is an 37 y.o. female.  HPI: Sarah Mueller is a  37 y/o F who c/o 2-3 h/o epigastric abd pain.  She had some accompanied n/v.  Secondary to increasing pain she presented to the ED for eval. She was eval'd in ED and was seen to have signs of pancretitis and US revealed stones.  Lipase returned to normal.  Pain slowly decreasing.    Past Medical History  Diagnosis Date  . Pancreatitis 03/30/2013    Past Surgical History  Procedure Laterality Date  . Cesarean section  2005  . Dilation and curettage of uterus  ~ 2000    History reviewed. No pertinent family history.  Social History:  reports that she has been smoking Cigarettes.  She has a 4.95 pack-year smoking history. She has never used smokeless tobacco. She reports that she drinks about 1.2 ounces of alcohol per week. She reports that she does not use illicit drugs.  Allergies: No Known Allergies  Medications: I have reviewed the patient's current medications.  Results for orders placed during the hospital encounter of 03/30/13 (from the past 48 hour(s))  CBC WITH DIFFERENTIAL     Status: None   Collection Time    03/30/13 12:39 PM      Result Value Range   WBC 10.0  4.0 - 10.5 K/uL   RBC 4.11  3.87 - 5.11 MIL/uL   Hemoglobin 13.0  12.0 - 15.0 g/dL   HCT 16.1  09.6 - 04.5 %   MCV 94.9  78.0 - 100.0 fL   MCH 31.6  26.0 - 34.0 pg   MCHC 33.3  30.0 - 36.0 g/dL   RDW 40.9  81.1 - 91.4 %   Platelets 286  150 - 400 K/uL   Neutrophils Relative % 74  43 - 77 %   Neutro Abs 7.4  1.7 - 7.7 K/uL   Lymphocytes Relative 20  12 - 46 %   Lymphs Abs 2.0  0.7 - 4.0 K/uL   Monocytes Relative 4  3 - 12 %   Monocytes Absolute 0.4  0.1 - 1.0 K/uL   Eosinophils Relative 1  0 - 5 %   Eosinophils Absolute 0.1  0.0 - 0.7 K/uL   Basophils Relative 0  0 - 1 %   Basophils Absolute 0.0  0.0 - 0.1 K/uL  COMPREHENSIVE METABOLIC PANEL     Status: Abnormal   Collection  Time    03/30/13 12:39 PM      Result Value Range   Sodium 141  135 - 145 mEq/L   Potassium 3.9  3.5 - 5.1 mEq/L   Chloride 104  96 - 112 mEq/L   CO2 23  19 - 32 mEq/L   Glucose, Bld 92  70 - 99 mg/dL   BUN 12  6 - 23 mg/dL   Creatinine, Ser 7.82  0.50 - 1.10 mg/dL   Calcium 8.9  8.4 - 95.6 mg/dL   Total Protein 7.1  6.0 - 8.3 g/dL   Albumin 3.5  3.5 - 5.2 g/dL   AST 18  0 - 37 U/L   ALT 21  0 - 35 U/L   Alkaline Phosphatase 73  39 - 117 U/L   Total Bilirubin 0.2 (*) 0.3 - 1.2 mg/dL   GFR calc non Af Amer >90  >90 mL/min   GFR calc Af Amer >90  >90 mL/min   Comment:  The eGFR has been calculated     using the CKD EPI equation.     This calculation has not been     validated in all clinical     situations.     eGFR's persistently     <90 mL/min signify     possible Chronic Kidney Disease.  LIPASE, BLOOD     Status: Abnormal   Collection Time    03/30/13 12:39 PM      Result Value Range   Lipase 179 (*) 11 - 59 U/L  URINALYSIS, ROUTINE W REFLEX MICROSCOPIC     Status: Abnormal   Collection Time    03/30/13  2:13 PM      Result Value Range   Color, Urine YELLOW  YELLOW   APPearance CLOUDY (*) CLEAR   Specific Gravity, Urine 1.019  1.005 - 1.030   pH 8.0  5.0 - 8.0   Glucose, UA NEGATIVE  NEGATIVE mg/dL   Hgb urine dipstick SMALL (*) NEGATIVE   Bilirubin Urine NEGATIVE  NEGATIVE   Ketones, ur NEGATIVE  NEGATIVE mg/dL   Protein, ur NEGATIVE  NEGATIVE mg/dL   Urobilinogen, UA 0.2  0.0 - 1.0 mg/dL   Nitrite NEGATIVE  NEGATIVE   Leukocytes, UA NEGATIVE  NEGATIVE  URINE MICROSCOPIC-ADD ON     Status: None   Collection Time    03/30/13  2:13 PM      Result Value Range   Squamous Epithelial / LPF RARE  RARE   WBC, UA 0-2  <3 WBC/hpf   RBC / HPF 0-2  <3 RBC/hpf   Bacteria, UA RARE  RARE   Urine-Other AMORPHOUS URATES/PHOSPHATES    PREGNANCY, URINE     Status: None   Collection Time    03/30/13  2:13 PM      Result Value Range   Preg Test, Ur NEGATIVE   NEGATIVE   Comment:            THE SENSITIVITY OF THIS     METHODOLOGY IS >20 mIU/mL.  POCT I-STAT TROPONIN I     Status: None   Collection Time    03/30/13  2:39 PM      Result Value Range   Troponin i, poc 0.00  0.00 - 0.08 ng/mL   Comment 3            Comment: Due to the release kinetics of cTnI,     a negative result within the first hours     of the onset of symptoms does not rule out     myocardial infarction with certainty.     If myocardial infarction is still suspected,     repeat the test at appropriate intervals.  CBC     Status: Abnormal   Collection Time    03/30/13  7:20 PM      Result Value Range   WBC 12.5 (*) 4.0 - 10.5 K/uL   RBC 3.80 (*) 3.87 - 5.11 MIL/uL   Hemoglobin 11.7 (*) 12.0 - 15.0 g/dL   HCT 16.1 (*) 09.6 - 04.5 %   MCV 92.9  78.0 - 100.0 fL   MCH 30.8  26.0 - 34.0 pg   MCHC 33.1  30.0 - 36.0 g/dL   RDW 40.9  81.1 - 91.4 %   Platelets 242  150 - 400 K/uL  CREATININE, SERUM     Status: None   Collection Time    03/30/13  7:20 PM      Result Value  Range   Creatinine, Ser 0.62  0.50 - 1.10 mg/dL   GFR calc non Af Amer >90  >90 mL/min   GFR calc Af Amer >90  >90 mL/min   Comment:            The eGFR has been calculated     using the CKD EPI equation.     This calculation has not been     validated in all clinical     situations.     eGFR's persistently     <90 mL/min signify     possible Chronic Kidney Disease.  COMPREHENSIVE METABOLIC PANEL     Status: Abnormal   Collection Time    03/31/13  4:57 AM      Result Value Range   Sodium 140  135 - 145 mEq/L   Potassium 3.8  3.5 - 5.1 mEq/L   Chloride 106  96 - 112 mEq/L   CO2 28  19 - 32 mEq/L   Glucose, Bld 109 (*) 70 - 99 mg/dL   BUN 7  6 - 23 mg/dL   Creatinine, Ser 1.61  0.50 - 1.10 mg/dL   Calcium 8.5  8.4 - 09.6 mg/dL   Total Protein 6.4  6.0 - 8.3 g/dL   Albumin 3.1 (*) 3.5 - 5.2 g/dL   AST 13  0 - 37 U/L   ALT 18  0 - 35 U/L   Alkaline Phosphatase 62  39 - 117 U/L   Total  Bilirubin 0.4  0.3 - 1.2 mg/dL   GFR calc non Af Amer >90  >90 mL/min   GFR calc Af Amer >90  >90 mL/min   Comment:            The eGFR has been calculated     using the CKD EPI equation.     This calculation has not been     validated in all clinical     situations.     eGFR's persistently     <90 mL/min signify     possible Chronic Kidney Disease.  LIPASE, BLOOD     Status: None   Collection Time    03/31/13  4:57 AM      Result Value Range   Lipase 15  11 - 59 U/L    Dg Chest 2 View  03/30/2013   *RADIOLOGY REPORT*  Clinical Data: Chest pain  CHEST - 2 VIEW  Comparison: None.  Findings: The lungs are well-aerated and free from pulmonary edema, focal airspace consolidation or pulmonary nodule.  Cardiac and mediastinal contours are within normal limits.  No pneumothorax, or pleural effusion. No acute osseous findings.  IMPRESSION:  No acute cardiopulmonary disease.   Original Report Authenticated By: Malachy Moan, M.D.   US Abdomen Complete  03/30/2013   *RADIOLOGY REPORT*  Clinical Data:  Abdominal pain.  ABDOMEN ULTRASOUND  Technique:  Complete abdominal ultrasound examination was performed including evaluation of the liver, gallbladder, bile ducts, pancreas, kidneys, spleen, IVC, and abdominal aorta.  Comparison:  None  Findings:  Gallbladder:  There is a dominant, shadowing gallstone located in the neck of the gallbladder and likely measuring over 3 cm in greatest diameter.  There is suggestion of also potentially some sludge and small additional calculi in the gallbladder.  There is no evidence of overt gallbladder wall thickening or sonographic Murphy's sign.  Common Bile Duct:  Normal caliber with measured diameter of 4 mm.  Liver:  No focal mass lesion seen.  Within normal  limits in parenchymal echogenicity.  IVC:  Patent throughout its visualized course in the abdomen.  Pancreas:  Although the pancreas is difficult to visualize in its entirety, no focal pancreatic abnormality is  identified.  Spleen:  The spleen is of normal echotexture and size.  Kidneys:  The right kidney measures 11.8 cm and the left kidney 10.3 cm.  Both have a normal sonographic appearance.  Abdominal Aorta:  Visualized abdominal aorta is of normal caliber. The distal aorta is not visualized.  IMPRESSION: Cholelithiasis with a large dominant shadowing gallstone identified in the neck of the gallbladder.  No overt associated evidence by ultrasound of cholecystitis or biliary obstruction.   Original Report Authenticated By: Irish Lack, M.D.    Review of Systems  Constitutional: Negative.   HENT: Negative.   Respiratory: Negative.   Cardiovascular: Negative.   Gastrointestinal: Positive for nausea, vomiting and abdominal pain. Negative for diarrhea.  Musculoskeletal: Negative.   Skin: Negative.   Neurological: Negative.   All other systems reviewed and are negative.   Blood pressure 124/65, pulse 87, temperature 98.2 F (36.8 C), temperature source Oral, resp. rate 18, height 5\' 6"  (1.676 m), weight 287 lb 4.2 oz (130.3 kg), last menstrual period 03/30/2013, SpO2 99.00%. Physical Exam  Constitutional: She is oriented to person, place, and time. She appears well-developed and well-nourished.  HENT:  Head: Normocephalic and atraumatic.  Eyes: Conjunctivae and EOM are normal. Pupils are equal, round, and reactive to light.  Neck: Normal range of motion. Neck supple.  Cardiovascular: Normal rate, regular rhythm and normal heart sounds.   Respiratory: Effort normal and breath sounds normal.  GI: Soft. Bowel sounds are normal. She exhibits no distension and no mass. There is tenderness (epigastric). There is no rebound and no guarding.  Musculoskeletal: Normal range of motion.  Neurological: She is alert and oriented to person, place, and time.    Assessment/Plan: 37 y/o F with Biliary pancreatitis -To OR for lap chole with IOC -All risks and benefits were discussed with the patient to  generally include: infection, bleeding, possible need for post op ERCP, damage to the bile ducts, and bile leak. Alternatives were offered and described.  All questions were answered and the patient voiced understanding of the procedure and wishes to proceed at this point with a laparoscopic cholecystectomy   Marigene Ehlers., Jed Limerick 03/31/2013, 1:31 PM

## 2013-03-31 NOTE — Op Note (Signed)
Pre Operative Diagnosis: biliary pancreatitis  Post Operative Diagnosis: same  Surgeon: Dr. Axel Filler   Procedure: lap chole IOC  Assistant: Ashok Norris, PA   Anesthesia: Gen. Endotracheal anesthesia   EBL: 15cc  Complications:  Counts: reported as correct x 2   Findings: The patient had normal IOC.  Acute imflammed GB  Indications for procedure: Pt is a 37 y/o F with 2 d/ h/o abd pain and n/v.  She was seen in the ED and was found to have Biliary pancreatitis.  She was counseled and elected for Lap chole.  Details of the procedure:  The patient was taken to the operating and placed in the supine position with bilateral SCDs in place. A time out was called and all facts were verified. A pneumoperitoneum was obtained via A Veress needle technique to a pressure of 14mm of mercury. A 5mm trochar was then placed in the right upper quadrant under visualization, and there were no injuries to any abdominal organs. A 11 mm port was then placed in the umbilical region after infiltrating with local anesthesia under direct visualization. A second and third epigastric port and right lower quadrant port placement under direct visualization, respectively. The gallbladder was identified and retracted, the peritoneum was then sharply dissected from the gallbladder and this dissection was carried down to Calot's triangle. The gallbladder was identified and stripped away circumferentially and seen going into the gallbladder 360. A Cook catheter was used to perform an intraoperative cholangiogram. The biliary radicals as well as the cystic duct and common bile duct were seen free of filling defects.  2 clips were placed proximally one distally and the cystic duct transected. The cystic artery was identified and 2 clips placed proximally and one distally and transected.  We then proceeded to remove the gallbladder off the hepatic fossa with Bovie cautery. A latex retrieval bag was then placed in the  abdomen and gallbladder placed in the bag. The hepatic fossa was then reexamined and hemostasis was achieved with Bovie cautery and was excellent at the end of the case. The subhepatic fossa and perihepatic fossa was then irrigated until the effluent was clear. The 11 mm trocar fascia was reapproximated with the Endo Close #1 Vicryl.  The pneumoperitoneum was evacuated and all trochars removed under direct visulalization.  The skin was then closed with 4-0 Monocryl and the skin dressed with Steri-Strips, gauze, and tape.  The patient was awaken from general anesthesia and taken to the recovery room in stable condition.

## 2013-04-01 ENCOUNTER — Encounter (INDEPENDENT_AMBULATORY_CARE_PROVIDER_SITE_OTHER): Payer: Self-pay | Admitting: Internal Medicine

## 2013-04-01 ENCOUNTER — Encounter (HOSPITAL_COMMUNITY): Payer: Self-pay | Admitting: General Surgery

## 2013-04-01 DIAGNOSIS — E876 Hypokalemia: Secondary | ICD-10-CM

## 2013-04-01 LAB — COMPREHENSIVE METABOLIC PANEL
Albumin: 2.6 g/dL — ABNORMAL LOW (ref 3.5–5.2)
BUN: 5 mg/dL — ABNORMAL LOW (ref 6–23)
Chloride: 101 mEq/L (ref 96–112)
Creatinine, Ser: 0.64 mg/dL (ref 0.50–1.10)
GFR calc Af Amer: >90 mL/min (ref >90)
GFR calc non Af Amer: >90 mL/min (ref >90)
Total Bilirubin: 0.4 mg/dL (ref 0.3–1.2)

## 2013-04-01 LAB — CBC
HCT: 34.2 % — ABNORMAL LOW (ref 36.0–46.0)
MCHC: 34.2 g/dL (ref 30.0–36.0)
MCV: 92.7 fL (ref 78.0–100.0)
RDW: 13 % (ref 11.5–15.5)
WBC: 11.5 10*3/uL — ABNORMAL HIGH (ref 4.0–10.5)

## 2013-04-01 MED ORDER — HYDROCODONE-ACETAMINOPHEN 7.5-325 MG PO TABS: 1.0000 | ORAL_TABLET | ORAL | Status: DC | PRN

## 2013-04-01 MED ORDER — HYDROCODONE-ACETAMINOPHEN 7.5-325 MG PO TABS
1.0000 | ORAL_TABLET | ORAL | Status: DC | PRN
  Administered 2013-04-01: 2 via ORAL
  Administered 2013-04-01: 1 via ORAL
  Filled 2013-04-01: qty 2
  Filled 2013-04-01: qty 1

## 2013-04-01 MED ORDER — POTASSIUM CHLORIDE CRYS ER 20 MEQ PO TBCR
40.0000 meq | EXTENDED_RELEASE_TABLET | Freq: Once | ORAL | Status: AC
  Administered 2013-04-01: 40 meq via ORAL
  Filled 2013-04-01: qty 1

## 2013-04-01 NOTE — Progress Notes (Signed)
Patient ID: Sarah Mueller, female   DOB: [birth year], 37 y.o.   MRN: [id number] Work Note  Patient was hospitalized from 03/31/13 to 04/01/13 due to emergency surgery.  She may return to work on 04/18/13 without restrictions.  If there are any questions please call our office.  Clance Boll, [place] [phone]

## 2013-04-01 NOTE — Progress Notes (Signed)
Patient discharged to home with friend.  Discharge instructions given including follow up care, medications, signs and symptoms of infection, diet and activity.  Verbalizes understanding with no further questions.  Vital signs stable. Tolerating regular diet with no complaints of nausea.  Discharged per wheelchair with friend.

## 2013-04-01 NOTE — Progress Notes (Signed)
[facility] PROGRESS NOTE  Sarah Mueller NWG:[id number] DOB: 1976-08-26 DOA: 03/30/2013 PCP: No PCP Per Patient  Assessment/Plan: Gallstone Pancreatitis, acute:  -Patient denies heavy alcohol use. Even though LFTs are normal,gallstones most likely etiology -lipase has normalized, LFTs nl, -Patient clinically improved, she is s/p  laparoscopic cholecystectomy on 5/29 and tolerating by mouth. -She is medically stable for discharge and is to follow up outpatient with her PCP-she states she has Medicaid and will establish PCP upon discharge. Obesity, unspecified  Cholelithiasis: See above. Ultrasound is without signs of cholecystitis or ductal dilatation. -Status post laparoscopic cholecystectomy as above per surgery -Seen by surgery this a.m. and okay for discharge. - consulted surgery as above for possible lap chole Tobacco abuse: Nicotine patch. Hypokalemia -Replace the k   Code Status: full Family Communication: mother and significant other at bedside Disposition Plan: to home when medically ready   Consultants:  Surgery  Procedures:  This was laparoscopic cholecystectomy on 5/29  Antibiotics:  none  HPI/Subjective: States decreased pain  Objective: Filed Vitals:   03/31/13 1810 03/31/13 2106 04/01/13 0244 04/01/13 0540  BP: 117/78 123/78 122/85 118/78  Pulse: 77 86 93 93  Temp: 99.1 F (37.3 C) 99.4 F (37.4 C) 99.4 F (37.4 C) 99 F (37.2 C)  TempSrc: Oral Oral Oral Oral  Resp: 20 20 20    Height:      Weight:      SpO2: 93% 94% 94% 96%    Intake/Output Summary (Last 24 hours) at 04/01/13 1003 Last data filed at 04/01/13 0526  Gross per 24 hour  Intake 3903.34 ml  Output   1750 ml  Net 2153.34 ml   Filed Weights   03/30/13 1750  Weight: 130.3 kg (287 lb 4.2 oz)    Exam:   General:  Alert and oriented x3  Cardiovascular: RRR  Respiratory: CTAB  Abdomen: soft, bowel sounds present, laparoscopic incisions with dressing clean and  dry  Extremities: No cyanosis and no edema   Data Reviewed: Basic Metabolic Panel:  Recent Labs Lab 03/30/13 1239 03/30/13 1920 03/31/13 0457 04/01/13 0645  NA 141  --  140 135  K 3.9  --  3.8 3.4*  CL 104  --  106 101  CO2 23  --  28 24  GLUCOSE 92  --  109* 102*  BUN 12  --  7 5*  CREATININE 0.74 0.62 0.75 0.64  CALCIUM 8.9  --  8.5 8.2*   Liver Function Tests:  Recent Labs Lab 03/30/13 1239 03/31/13 0457 04/01/13 0645  AST 18 13 39*  ALT 21 18 32  ALKPHOS 73 62 59  BILITOT 0.2* 0.4 0.4  PROT 7.1 6.4 6.2  ALBUMIN 3.5 3.1* 2.6*    Recent Labs Lab 03/30/13 1239 03/31/13 0457  LIPASE 179* 15   No results found for this basename: AMMONIA,  in the last 168 hours CBC:  Recent Labs Lab 03/30/13 1239 03/30/13 1920 04/01/13 0645  WBC 10.0 12.5* 11.5*  NEUTROABS 7.4  --   --   HGB 13.0 11.7* 11.7*  HCT 39.0 35.3* 34.2*  MCV 94.9 92.9 92.7  PLT 286 242 193   Cardiac Enzymes: No results found for this basename: CKTOTAL, CKMB, CKMBINDEX, TROPONINI,  in the last 168 hours BNP (last 3 results) No results found for this basename: PROBNP,  in the last 8760 hours CBG: No results found for this basename: GLUCAP,  in the last 168 hours  Recent Results (from the past 240 hour(s))  SURGICAL PCR  SCREEN     Status: None   Collection Time    03/31/13  1:33 PM      Result Value Range Status   MRSA, PCR NEGATIVE  NEGATIVE Final   Staphylococcus aureus NEGATIVE  NEGATIVE Final   Comment:            The Xpert SA Assay (FDA     approved for NASAL specimens     in patients over 72 years of age),     is one component of     a comprehensive surveillance     program.  Test performance has     been validated by [employer] for patients greater     than or equal to 40 year old.     It is not intended     to diagnose infection nor to     guide or monitor treatment.     Studies: Dg Chest 2 View  03/30/2013   *RADIOLOGY REPORT*  Clinical Data: Chest pain  CHEST  - 2 VIEW  Comparison: None.  Findings: The lungs are well-aerated and free from pulmonary edema, focal airspace consolidation or pulmonary nodule.  Cardiac and mediastinal contours are within normal limits.  No pneumothorax, or pleural effusion. No acute osseous findings.  IMPRESSION:  No acute cardiopulmonary disease.   Original Report Authenticated By: Malachy Moan, M.D.   Dg Cholangiogram Operative  03/31/2013   *RADIOLOGY REPORT*  Clinical Data: Cholecystectomy  INTRAOPERATIVE CHOLANGIOGRAM  Technique:  Multiple fluoroscopic spot radiographs were obtained during intraoperative cholangiogram and are submitted for interpretation post-operatively.  Comparison: None.  Findings: No persistent filling defects in the common duct. Intrahepatic ducts are incompletely visualized, appearing decompressed centrally. Contrast passes into the duodenum.  IMPRESSION  Negative for retained common duct stone.   Original Report Authenticated By: D. Andria Rhein, MD   US Abdomen Complete  03/30/2013   *RADIOLOGY REPORT*  Clinical Data:  Abdominal pain.  ABDOMEN ULTRASOUND  Technique:  Complete abdominal ultrasound examination was performed including evaluation of the liver, gallbladder, bile ducts, pancreas, kidneys, spleen, IVC, and abdominal aorta.  Comparison:  None  Findings:  Gallbladder:  There is a dominant, shadowing gallstone located in the neck of the gallbladder and likely measuring over 3 cm in greatest diameter.  There is suggestion of also potentially some sludge and small additional calculi in the gallbladder.  There is no evidence of overt gallbladder wall thickening or sonographic Murphy's sign.  Common Bile Duct:  Normal caliber with measured diameter of 4 mm.  Liver:  No focal mass lesion seen.  Within normal limits in parenchymal echogenicity.  IVC:  Patent throughout its visualized course in the abdomen.  Pancreas:  Although the pancreas is difficult to visualize in its entirety, no focal pancreatic  abnormality is identified.  Spleen:  The spleen is of normal echotexture and size.  Kidneys:  The right kidney measures 11.8 cm and the left kidney 10.3 cm.  Both have a normal sonographic appearance.  Abdominal Aorta:  Visualized abdominal aorta is of normal caliber. The distal aorta is not visualized.  IMPRESSION: Cholelithiasis with a large dominant shadowing gallstone identified in the neck of the gallbladder.  No overt associated evidence by ultrasound of cholecystitis or biliary obstruction.   Original Report Authenticated By: Irish Lack, M.D.    Scheduled Meds: . heparin  5,000 Units Subcutaneous Q8H  . nicotine  7 mg Transdermal Daily   Continuous Infusions:    Principal Problem:  Pancreatitis, acute Active Problems:   Obesity, unspecified   Cholelithiasis   Tobacco abuse    Time spent: 27    [facility]  [facility] Pager [phone]. If 7PM-7AM, please contact night-coverage at [email or web address], password [facility] 04/01/2013, 10:03 AM  LOS: 2 days

## 2013-04-01 NOTE — Anesthesia Postprocedure Evaluation (Signed)
Anesthesia Post Note  Patient: Sarah Mueller  Procedure(s) Performed: Procedure(s) (LRB): LAPAROSCOPIC CHOLECYSTECTOMY WITH INTRAOPERATIVE CHOLANGIOGRAM (N/A)  Anesthesia type: General  Patient location: PACU  Post pain: Pain level controlled and Adequate analgesia  Post assessment: Post-op Vital signs reviewed, Patient's Cardiovascular Status Stable, Respiratory Function Stable, Patent Airway and Pain level controlled  Last Vitals:  Filed Vitals:   04/01/13 0540  BP: 118/78  Pulse: 93  Temp: 37.2 C  Resp:     Post vital signs: Reviewed and stable  Level of consciousness: awake, alert  and oriented  Complications: No apparent anesthesia complications

## 2013-04-01 NOTE — Discharge Instructions (Signed)
[facility] ______[facility] °LAPAROSCOPIC SURGERY: POST OP INSTRUCTIONS °Always review your discharge instruction sheet given to you by the facility where your surgery was performed. °IF YOU HAVE DISABILITY OR FAMILY LEAVE FORMS, YOU MUST BRING THEM TO THE OFFICE FOR PROCESSING.   °DO NOT GIVE THEM TO YOUR DOCTOR. ° °1. A prescription for pain medication may be given to you upon discharge.  Take your pain medication as prescribed, if needed.  If narcotic pain medicine is not needed, then you may take acetaminophen (Tylenol) or ibuprofen (Advil) as needed. °2. Take your usually prescribed medications unless otherwise directed. °3. If you need a refill on your pain medication, please contact your pharmacy.  They will contact our office to request authorization. Prescriptions will not be filled after 5pm or on week-ends. °4. You should follow a light diet the first few days after arrival home, such as soup and crackers, etc.  Be sure to include lots of fluids daily. °5. Most patients will experience some swelling and bruising in the area of the incisions.  Ice packs will help.  Swelling and bruising can take several days to resolve.  °6. It is common to experience some constipation if taking pain medication after surgery.  Increasing fluid intake and taking a stool softener (such as Colace) will usually help or prevent this problem from occurring.  A mild laxative (Milk of Magnesia or Miralax) should be taken according to package instructions if there are no bowel movements after 48 hours. °7. Unless discharge instructions indicate otherwise, you may remove your bandages 24-48 hours after surgery, and you may shower at that time.  You may have steri-strips (small skin tapes) in place directly over the incision.  These strips should be left on the skin for 7-10 days.  If your surgeon used skin glue on the incision, you may shower in 24 hours.  The glue will flake off over the next 2-3 weeks.  Any sutures or  staples will be removed at the office during your follow-up visit. °8. ACTIVITIES:  You may resume regular (light) daily activities beginning the next day--such as daily self-care, walking, climbing stairs--gradually increasing activities as tolerated.  You may have sexual intercourse when it is comfortable.  Refrain from any heavy lifting or straining until approved by your doctor. °a. You may drive when you are no longer taking prescription pain medication, you can comfortably wear a seatbelt, and you can safely maneuver your car and apply brakes. °b. RETURN TO WORK:  __________________________________________________________ °9. You should see your doctor in the office for a follow-up appointment approximately 2-3 weeks after your surgery.  Make sure that you call for this appointment within a day or two after you arrive home to insure a convenient appointment time. °10. OTHER INSTRUCTIONS: __________________________________________________________________________________________________________________________ __________________________________________________________________________________________________________________________ °WHEN TO CALL YOUR DOCTOR: °1. Fever over 101.0 °2. Inability to urinate °3. Continued bleeding from incision. °4. Increased pain, redness, or drainage from the incision. °5. Increasing abdominal pain ° °The clinic staff is available to answer your questions during regular business hours.  Please don’t hesitate to call and ask to speak to one of the nurses for clinical concerns.  If you have a medical emergency, go to the nearest emergency room or call 911.  A surgeon from [facility] is always on call at the hospital. °[address], [address], [place], [place]  [address] ? [address] [address], [place], [place]   [address] °[phone] ? [phone] ? FAX [phone] °Web site:   [email or web address] °

## 2013-04-01 NOTE — Discharge Summary (Signed)
  Physician Discharge Summary  Patient ID: Sarah Mueller MRN: [id number] DOB/AGE: Jul 19, 1976 37 y.o.  Admit date: 03/30/2013 Discharge date: 04/01/2013  Admitting Diagnosis: Bilary Pancreatitis   Discharge Diagnosis Same  Consultants None  Procedures Laparoscopic Cholecystectomy with [facility]  Hospital Course 37 yr old female who presented to [facility] with abdominal pain.  Workup showed bilary pancreatitis.  Patient was admitted and underwent procedure listed above.  Tolerated procedure well and was transferred to the floor.  Diet was advanced as tolerated.  On POD#1, the patient was voiding well, tolerating diet, ambulating well, pain well controlled, vital signs stable, incisions c/d/i and felt stable for discharge home.  Patient will follow up in our office in 2 weeks and knows to call with questions or concerns.  Physical Exam at Discharge VSS afebrile Heart: RRR Lungs: CTA bil Abd: soft, expected tenderness, +BS, incisions c/d/i     Medication List    TAKE these medications       HYDROcodone-acetaminophen 7.5-325 MG per tablet  Commonly known as:  NORCO  Take 1-2 tablets by mouth every 4 (four) hours as needed.     naproxen sodium 220 MG tablet  Commonly known as:  ANAPROX  Take 880 mg by mouth daily as needed (Pain).             Follow-up Information   Follow up with [facility],MD, MD In 2 weeks. (Our office will contact you with your follow up appt day and time)    Contact information:   [address] [place] [address] [place] [address] [phone]       Signed: Clance Boll, [facility] [phone]  04/01/2013, 9:31 AM

## 2013-04-19 ENCOUNTER — Encounter (INDEPENDENT_AMBULATORY_CARE_PROVIDER_SITE_OTHER): Payer: Self-pay

## 2013-05-03 ENCOUNTER — Ambulatory Visit (INDEPENDENT_AMBULATORY_CARE_PROVIDER_SITE_OTHER): Payer: Medicaid Other | Admitting: Internal Medicine

## 2013-05-03 ENCOUNTER — Encounter (INDEPENDENT_AMBULATORY_CARE_PROVIDER_SITE_OTHER): Payer: Self-pay

## 2013-05-03 VITALS — BP 130/84 | HR 92 | Temp 98.6°F | Resp 12 | Ht 66.0 in | Wt 266.6 lb

## 2013-05-03 DIAGNOSIS — K851 Biliary acute pancreatitis without necrosis or infection: Secondary | ICD-10-CM

## 2013-05-03 DIAGNOSIS — K859 Acute pancreatitis without necrosis or infection, unspecified: Secondary | ICD-10-CM

## 2013-05-03 NOTE — Progress Notes (Signed)
  Subjective: Pt returns to the clinic today after undergoing laparoscopic cholecystectomy on 03/31/13.  The patient is tolerating their diet well and is having no severe pain.  Bowel function is good.  No problems with the wounds.  Objective: Vital signs in last 24 hours: Reviewed  PE: Abd: soft, non-tender, +bs, incisions well healed  Lab Results:  No results found for this basename: WBC, HGB, HCT, PLT,  in the last 72 hours BMET No results found for this basename: NA, K, CL, CO2, GLUCOSE, BUN, CREATININE, CALCIUM,  in the last 72 hours PT/INR No results found for this basename: LABPROT, INR,  in the last 72 hours CMP     Component Value Date/Time   NA 135 04/01/2013 0645   K 3.4* 04/01/2013 0645   CL 101 04/01/2013 0645   CO2 24 04/01/2013 0645   GLUCOSE 102* 04/01/2013 0645   BUN 5* 04/01/2013 0645   CREATININE 0.64 04/01/2013 0645   CALCIUM 8.2* 04/01/2013 0645   PROT 6.2 04/01/2013 0645   ALBUMIN 2.6* 04/01/2013 0645   AST 39* 04/01/2013 0645   ALT 32 04/01/2013 0645   ALKPHOS 59 04/01/2013 0645   BILITOT 0.4 04/01/2013 0645   GFRNONAA >90 04/01/2013 0645   GFRAA >90 04/01/2013 0645   Lipase     Component Value Date/Time   LIPASE 15 03/31/2013 0457       Studies/Results: No results found.  Anti-infectives: Anti-infectives   None       Assessment/Plan  1.  S/P Laparoscopic Cholecystectomy: doing well, may resume regular activity without restrictions, Pt will follow up with [place] [employer] and knows to call with questions or concerns.     Jolon Degante 05/03/2013

## 2013-05-03 NOTE — Patient Instructions (Signed)
May resume regular activity without restrictions. Follow up as needed. Call with questions or concerns.  

## 2013-05-20 ENCOUNTER — Emergency Department (HOSPITAL_COMMUNITY)
Admission: EM | Admit: 2013-05-20 | Discharge: 2013-05-20 | Disposition: A | Discharge status: Home / Self Care | Payer: Medicaid Other | Attending: Emergency Medicine | Admitting: Emergency Medicine

## 2013-05-20 ENCOUNTER — Encounter (HOSPITAL_COMMUNITY): Payer: Self-pay | Admitting: Emergency Medicine

## 2013-05-20 DIAGNOSIS — R350 Frequency of micturition: Secondary | ICD-10-CM | POA: Insufficient documentation

## 2013-05-20 DIAGNOSIS — R109 Unspecified abdominal pain: Secondary | ICD-10-CM | POA: Insufficient documentation

## 2013-05-20 DIAGNOSIS — Z9889 Other specified postprocedural states: Secondary | ICD-10-CM | POA: Insufficient documentation

## 2013-05-20 DIAGNOSIS — R3915 Urgency of urination: Secondary | ICD-10-CM | POA: Insufficient documentation

## 2013-05-20 DIAGNOSIS — Z9089 Acquired absence of other organs: Secondary | ICD-10-CM | POA: Insufficient documentation

## 2013-05-20 DIAGNOSIS — Z3202 Encounter for pregnancy test, result negative: Secondary | ICD-10-CM | POA: Insufficient documentation

## 2013-05-20 DIAGNOSIS — F17200 Nicotine dependence, unspecified, uncomplicated: Secondary | ICD-10-CM | POA: Insufficient documentation

## 2013-05-20 DIAGNOSIS — N390 Urinary tract infection, site not specified: Secondary | ICD-10-CM | POA: Insufficient documentation

## 2013-05-20 DIAGNOSIS — Z8719 Personal history of other diseases of the digestive system: Secondary | ICD-10-CM | POA: Insufficient documentation

## 2013-05-20 LAB — PREGNANCY, URINE: Preg Test, Ur: NEGATIVE

## 2013-05-20 LAB — URINALYSIS, ROUTINE W REFLEX MICROSCOPIC
Glucose, UA: NEGATIVE mg/dL
Nitrite: POSITIVE — AB
Protein, ur: 30 mg/dL — AB
Urobilinogen, UA: 1 mg/dL (ref 0.0–1.0)

## 2013-05-20 MED ORDER — SULFAMETHOXAZOLE-TRIMETHOPRIM 800-160 MG PO TABS: 1.0000 | ORAL_TABLET | Freq: Two times a day (BID) | ORAL | Status: DC

## 2013-05-20 MED ORDER — PHENAZOPYRIDINE HCL 200 MG PO TABS: 200.0000 mg | ORAL_TABLET | Freq: Three times a day (TID) | ORAL | Status: DC

## 2013-05-20 MED ORDER — SULFAMETHOXAZOLE-TMP DS 800-160 MG PO TABS
1.0000 | ORAL_TABLET | Freq: Once | ORAL | Status: AC
  Administered 2013-05-20: 1 via ORAL
  Filled 2013-05-20: qty 1

## 2013-05-20 NOTE — ED Provider Notes (Signed)
Medical screening examination/treatment/procedure(s) were conducted as a shared visit with non-physician practitioner(s) and myself.  I personally evaluated the patient during the encounter  Dysuria with urgency and frequency.  Appears well. No abdominal pain, fever, vomiting.  Glynn Octave, MD 05/20/13 1534

## 2013-05-20 NOTE — ED Notes (Signed)
Pt c/o urinary frequency and burning with urination for about 1 week. Pt also reports some lower abdominal pain. Pt denies N/V.

## 2013-05-20 NOTE — ED Provider Notes (Signed)
History    CSN: [id number] Arrival date & time 05/20/13  0807  First MD Initiated Contact with Patient 05/20/13 0831     Chief Complaint  Patient presents with  . Urinary Tract Infection   (Consider location/radiation/quality/duration/timing/severity/associated sxs/prior Treatment) HPI  37 year old female presents complaining of dysuria. Patient states for the past week she has had intermittent discomfort when urinating. Describe a burning sensation at the end of her urine streams along with urgency and frequency. She also endorsed occasional suprapubic abdominal tenderness that is mild. No complaints of fever, chills, nausea, vomiting, diarrhea, chest pain, shortness of breath, back pain, hematuria, hematochezia, or melena. Denies any vaginal discharge or rash. Last menstrual period was July 1.  Patient did try taking several amoxicillin by mouth from a friend with no relief. No history of recurrent UTI  Past Medical History  Diagnosis Date  . Pancreatitis 03/30/2013   Past Surgical History  Procedure Laterality Date  . Cesarean section  2005  . Dilation and curettage of uterus  ~ 2000  . Cholecystectomy N/A 03/31/2013    Procedure: LAPAROSCOPIC CHOLECYSTECTOMY WITH INTRAOPERATIVE CHOLANGIOGRAM;  Surgeon: Axel Filler, MD;  Location: [facility] OR;  Service: General;  Laterality: N/A;   No family history on file. History  Substance Use Topics  . Smoking status: Current Every Day Smoker -- 0.33 packs/day for 15 years    Types: Cigarettes  . Smokeless tobacco: Never Used     Comment: 03/30/2013 offered smoking cessation materials; pt declines  . Alcohol Use: 1.2 oz/week    1 Glasses of wine, 1 Shots of liquor per week     Comment: 05/20/2013 "once or twice/wk I'll have a beer or glass of wine or mixed drink"   OB History   Grav Para Term Preterm Abortions TAB SAB Ect Mult Living                 Review of Systems  All other systems reviewed and are negative.    Allergies   Review of patient's allergies indicates no known allergies.  Home Medications  No current outpatient prescriptions on file. BP 123/80  Pulse 96  Temp(Src) 98.7 F (37.1 C)  Ht 5\' 6"  (1.676 m)  Wt 265 lb (120.203 kg)  BMI 42.79 kg/m2  SpO2 98%  LMP 05/03/2013 Physical Exam  Nursing note and vitals reviewed. Constitutional: She appears well-developed and well-nourished. No distress.  Awake, alert, nontoxic appearance  HENT:  Head: Atraumatic.  Eyes: Conjunctivae are normal. Right eye exhibits no discharge. Left eye exhibits no discharge.  Neck: Neck supple.  Cardiovascular: Normal rate and regular rhythm.   Pulmonary/Chest: Effort normal. No respiratory distress. She exhibits no tenderness.  Abdominal: Soft. There is no tenderness (no focal point tenderness). There is no rebound.  Genitourinary:  No CVA tenderness  Musculoskeletal: She exhibits no tenderness.  ROM appears intact, no obvious focal weakness  Neurological:  Mental status and motor strength appears intact  Skin: No rash noted.  Psychiatric: She has a normal mood and affect.    ED Course  Procedures (including critical care time)  8:50 AM Pt with dysuria.  Will check UA/preg.  If unremarkable, will consider performing pelvic exam.  However, pt appears comfortable, abd non tender, is afebrile, VSS.  No CVA tenderness.    9:38 AM UA is compatible with UTI.  Pt able to tolerates PO, no evidence of pyelonephritis.  Preg test neg.  Will treat with bactrim.  Pt stable for discharge, return precaution discussed.  Labs Reviewed  URINALYSIS, ROUTINE W REFLEX MICROSCOPIC - Abnormal; Notable for the following:    APPearance TURBID (*)    Hgb urine dipstick MODERATE (*)    Bilirubin Urine SMALL (*)    Ketones, ur 15 (*)    Protein, ur 30 (*)    Nitrite POSITIVE (*)    Leukocytes, UA LARGE (*)    All other components within normal limits  URINE MICROSCOPIC-ADD ON - Abnormal; Notable for the following:     Squamous Epithelial / LPF FEW (*)    Bacteria, UA MANY (*)    All other components within normal limits  URINE CULTURE  PREGNANCY, URINE   No results found. 1. UTI (lower urinary tract infection)     MDM  BP 123/80  Pulse 96  Temp(Src) 98.7 F (37.1 C)  Ht 5\' 6"  (1.676 m)  Wt 265 lb (120.203 kg)  BMI 42.79 kg/m2  SpO2 98%  LMP 05/03/2013  I have reviewed nursing notes and vital signs.  I reviewed available ER/hospitalization records thought the EMR   Fayrene Helper, [place] 05/20/13 1610

## 2013-05-22 LAB — URINE CULTURE: Colony Count: >=100000

## 2013-05-23 NOTE — Progress Notes (Signed)
  ED Antimicrobial Stewardship Positive Culture Follow Up   Sarah Mueller is an 37 y.o. female who presented to [facility] on 05/20/2013 with a chief complaint of  Chief Complaint  Patient presents with  . Urinary Tract Infection    Recent Results (from the past 720 hour(s))  URINE CULTURE     Status: None   Collection Time    05/20/13  8:57 AM      Result Value Range Status   Specimen Description URINE, CLEAN CATCH   Final   Special Requests NONE   Final   Culture  Setup Time 05/20/2013 10:13   Final   Colony Count >=100,000 COLONIES/ML   Final   Culture ESCHERICHIA COLI   Final   Report Status 05/22/2013 FINAL   Final   Organism ID, Bacteria ESCHERICHIA COLI   Final    [x]  Treated with Bactrim, organism resistant to prescribed antimicrobial []  Patient discharged originally without antimicrobial agent and treatment is now indicated  New antibiotic prescription: Keflex 500mg  PO BID x 7 days  ED Provider: Trixie Dredge, PA-C   Cleon Dew 05/23/2013, 10:10 AM Infectious Diseases Pharmacist Phone# [phone]

## 2013-05-23 NOTE — ED Notes (Signed)
Post ED Visit - Positive Culture Follow-up: Successful Patient Follow-Up  Culture assessed and recommendations reviewed by: [x]  Wes Dulaney, Pharm.D., BCPS []  Celedonio Miyamoto, Pharm.D., BCPS []  Georgina Pillion, [address].D., BCPS []  [place], [address].D., BCPS, AAHIVP []  Estella Husk, Pharm.D., BCPS, AAHIVP  Positive Urine culture  []  Patient discharged without antimicrobial prescription and treatment is now indicated [x]  Organism is resistant to prescribed ED discharge antimicrobial []  Patient with positive blood cultures  Changes discussed with ED provider: Trixie Dredge New antibiotic prescription Keflex 500 mg po BID x 7days   Contacted patient,Left voice mail message date 05/23/2013, time 1409  Larena Sox 05/23/2013, 2:07 PM

## 2013-05-28 ENCOUNTER — Telehealth (HOSPITAL_COMMUNITY): Payer: Self-pay | Admitting: Emergency Medicine

## 2014-02-25 ENCOUNTER — Emergency Department (HOSPITAL_COMMUNITY)
Admission: EM | Admit: 2014-02-25 | Discharge: 2014-02-25 | Disposition: A | Discharge status: Home / Self Care | Payer: Medicaid Other | Attending: Emergency Medicine | Admitting: Emergency Medicine

## 2014-02-25 ENCOUNTER — Emergency Department (HOSPITAL_COMMUNITY): Payer: Medicaid Other

## 2014-02-25 ENCOUNTER — Encounter (HOSPITAL_COMMUNITY): Payer: Self-pay | Admitting: Emergency Medicine

## 2014-02-25 DIAGNOSIS — M25569 Pain in unspecified knee: Secondary | ICD-10-CM | POA: Insufficient documentation

## 2014-02-25 DIAGNOSIS — F17200 Nicotine dependence, unspecified, uncomplicated: Secondary | ICD-10-CM | POA: Insufficient documentation

## 2014-02-25 DIAGNOSIS — M25559 Pain in unspecified hip: Secondary | ICD-10-CM | POA: Insufficient documentation

## 2014-02-25 DIAGNOSIS — M25551 Pain in right hip: Secondary | ICD-10-CM

## 2014-02-25 DIAGNOSIS — Z79899 Other long term (current) drug therapy: Secondary | ICD-10-CM | POA: Insufficient documentation

## 2014-02-25 DIAGNOSIS — M25562 Pain in left knee: Secondary | ICD-10-CM

## 2014-02-25 DIAGNOSIS — Z8719 Personal history of other diseases of the digestive system: Secondary | ICD-10-CM | POA: Insufficient documentation

## 2014-02-25 MED ORDER — HYDROCODONE-ACETAMINOPHEN 5-325 MG PO TABS: 1.0000 | ORAL_TABLET | Freq: Four times a day (QID) | ORAL | Status: DC | PRN

## 2014-02-25 NOTE — ED Notes (Signed)
Pt from home c/o R hip pain x1 year and L knee pain all her life. Pt reports that she has lost over 100lbs and feels that she should not have pain. Pt reports that she is "eating ibuprofen" for pain control. Pt is A&O and in NAD

## 2014-02-25 NOTE — ED Provider Notes (Signed)
Medical screening examination/treatment/procedure(s) were performed by non-physician practitioner and as supervising physician I was immediately available for consultation/collaboration.   EKG Interpretation None      Rolland Porter, MD, Abram Sander   Janice Norrie, MD 02/25/14 [phone]

## 2014-02-25 NOTE — ED Provider Notes (Signed)
CSN: [id number]     Arrival date & time 02/25/14  1424 History  This chart was scribed for non-physician practitioner, Quincy Carnes, PA-C working with Janice Norrie, MD by Frederich Balding, ED scribe. This patient was seen in room WTR8/WTR8 and the patient's care was started at 4:38 PM.    Chief Complaint  Patient presents with  . Knee Pain  . Hip Pain   The history is provided by the patient. No language interpreter was used.   HPI Comments: Sarah Mueller is a 38 y.o. female who presents to the Emergency Department complaining of left knee pain and right hip pain that has been intermittent for over one year. She states she lost 70 pounds to try to relieve the pain without significant improvement.  She also walks a lot at her job and exercises frequqently. Certain movements and bearing weight worsen the pain. Sitting in a straight back chair relieves some pain.  Pt denies numbness or paresthesias of either lower extremity.  Pt has been taking ibuprofen with only minimal relief of her pain.  Denies prolonged periods of steroids use.  No hx of IV drug use.  VS stable on arrival.  Past Medical History  Diagnosis Date  . Pancreatitis 03/30/2013   Past Surgical History  Procedure Laterality Date  . Cesarean section  2005  . Dilation and curettage of uterus  ~ 2000  . Cholecystectomy N/A 03/31/2013    Procedure: LAPAROSCOPIC CHOLECYSTECTOMY WITH INTRAOPERATIVE CHOLANGIOGRAM;  Surgeon: Ralene Ok, MD;  Location: [facility];  Service: General;  Laterality: N/A;   No family history on file. History  Substance Use Topics  . Smoking status: Current Every Day Smoker -- 0.33 packs/day for 15 years    Types: Cigarettes  . Smokeless tobacco: Never Used     Comment: 03/30/2013 offered smoking cessation materials; pt declines  . Alcohol Use: 1.2 oz/week    1 Glasses of wine, 1 Shots of liquor per week     Comment: 05/20/2013 "once or twice/wk I'll have a beer or glass of wine or mixed drink"   OB History    Grav Para Term Preterm Abortions TAB SAB Ect Mult Living                 Review of Systems  Musculoskeletal: Positive for arthralgias.  Neurological: Negative for numbness.  All other systems reviewed and are negative.  Allergies  Review of patient's allergies indicates no known allergies.  Home Medications   Prior to Admission medications   Medication Sig Start Date End Date Taking? Authorizing Provider  phenazopyridine (PYRIDIUM) 200 MG tablet Take 1 tablet (200 mg total) by mouth 3 (three) times daily. 05/20/13   Domenic Moras, PA-C  sulfamethoxazole-trimethoprim (BACTRIM DS,SEPTRA DS) 800-160 MG per tablet Take 1 tablet by mouth 2 (two) times daily. 05/20/13   Domenic Moras, PA-C   BP 130/89  Pulse 90  Temp(Src) 98.6 F (37 C) (Oral)  Resp 16  SpO2 97%  LMP 02/11/2014  Physical Exam  Nursing note and vitals reviewed. Constitutional: She is oriented to person, place, and time. She appears well-developed and well-nourished. No distress.  HENT:  Head: Normocephalic and atraumatic.  Mouth/Throat: Oropharynx is clear and moist.  Eyes: Conjunctivae and EOM are normal. Pupils are equal, round, and reactive to light.  Neck: Normal range of motion. Neck supple.  Cardiovascular: Normal rate, regular rhythm and normal heart sounds.   Pulmonary/Chest: Effort normal and breath sounds normal. No respiratory distress. She has no  wheezes.  Musculoskeletal: Normal range of motion. She exhibits no edema.       Right hip: Normal. She exhibits normal range of motion, normal strength, no tenderness and no bony tenderness.       Left knee: Normal.  Pain of right lateral hip without focal tenderness to palpation. No gross deformity.  Full flexion and extension of left knee. Some crepitus noted. No deformities.  Sensation intact diffusely throughout BLE; strong distal pulses and cap refill; ambulating unassisted without difficulty  Neurological: She is alert and oriented to person, place, and time.   Skin: Skin is warm and dry. She is not diaphoretic.  Psychiatric: She has a normal mood and affect.    ED Course  Procedures (including critical care time)  DIAGNOSTIC STUDIES: Oxygen Saturation is 97% on RA, normal by my interpretation.    COORDINATION OF CARE: 4:42 PM-Discussed treatment plan which includes hip xray with pt at bedside and pt agreed to plan.   Labs Review Labs Reviewed - No data to display  Imaging Review Dg Hip Complete Right  02/25/2014   CLINICAL DATA:  Right hip pain x1 year  EXAM: RIGHT HIP - COMPLETE 2+ VIEW  COMPARISON:  None.  FINDINGS: No fracture or dislocation is seen.  Bilateral hip joint spaces are symmetric.  Visualized bony pelvis appears intact.  IMPRESSION: Normal right hip radiographs.   Electronically Signed   By: Julian Hy M.D.   On: 02/25/2014 17:27     EKG Interpretation None      MDM   Final diagnoses:  Hip pain, right  Knee pain, left   X-ray negative for acute abnormalities, symptoms likely related to osteoarthritis.  Offered patient orthopedic followup, she declined stating she did see her primary care physician if symptoms worsen. Rx Vicodin.  Discussed plan with patient, he/she acknowledged understanding and agreed with plan of care.  Return precautions given for new or worsening symptoms.  I personally performed the services described in this documentation, which was scribed in my presence. The recorded information has been reviewed and is accurate.  Larene Pickett, PA-C 02/25/14 1816

## 2014-02-25 NOTE — Discharge Instructions (Signed)
Take the prescribed medication as directed.  Do not drive while taking this medication. Follow-up with your primary care physician as needed. Return to the ED for new or worsening symptoms.

## 2014-04-19 IMAGING — CR DG CHEST 2V
2 series · 2 of 2 positions shown · non-contrast
Comparison: None.

CLINICAL DATA: Chest pain

CHEST - 2 VIEW

[w chest pa]
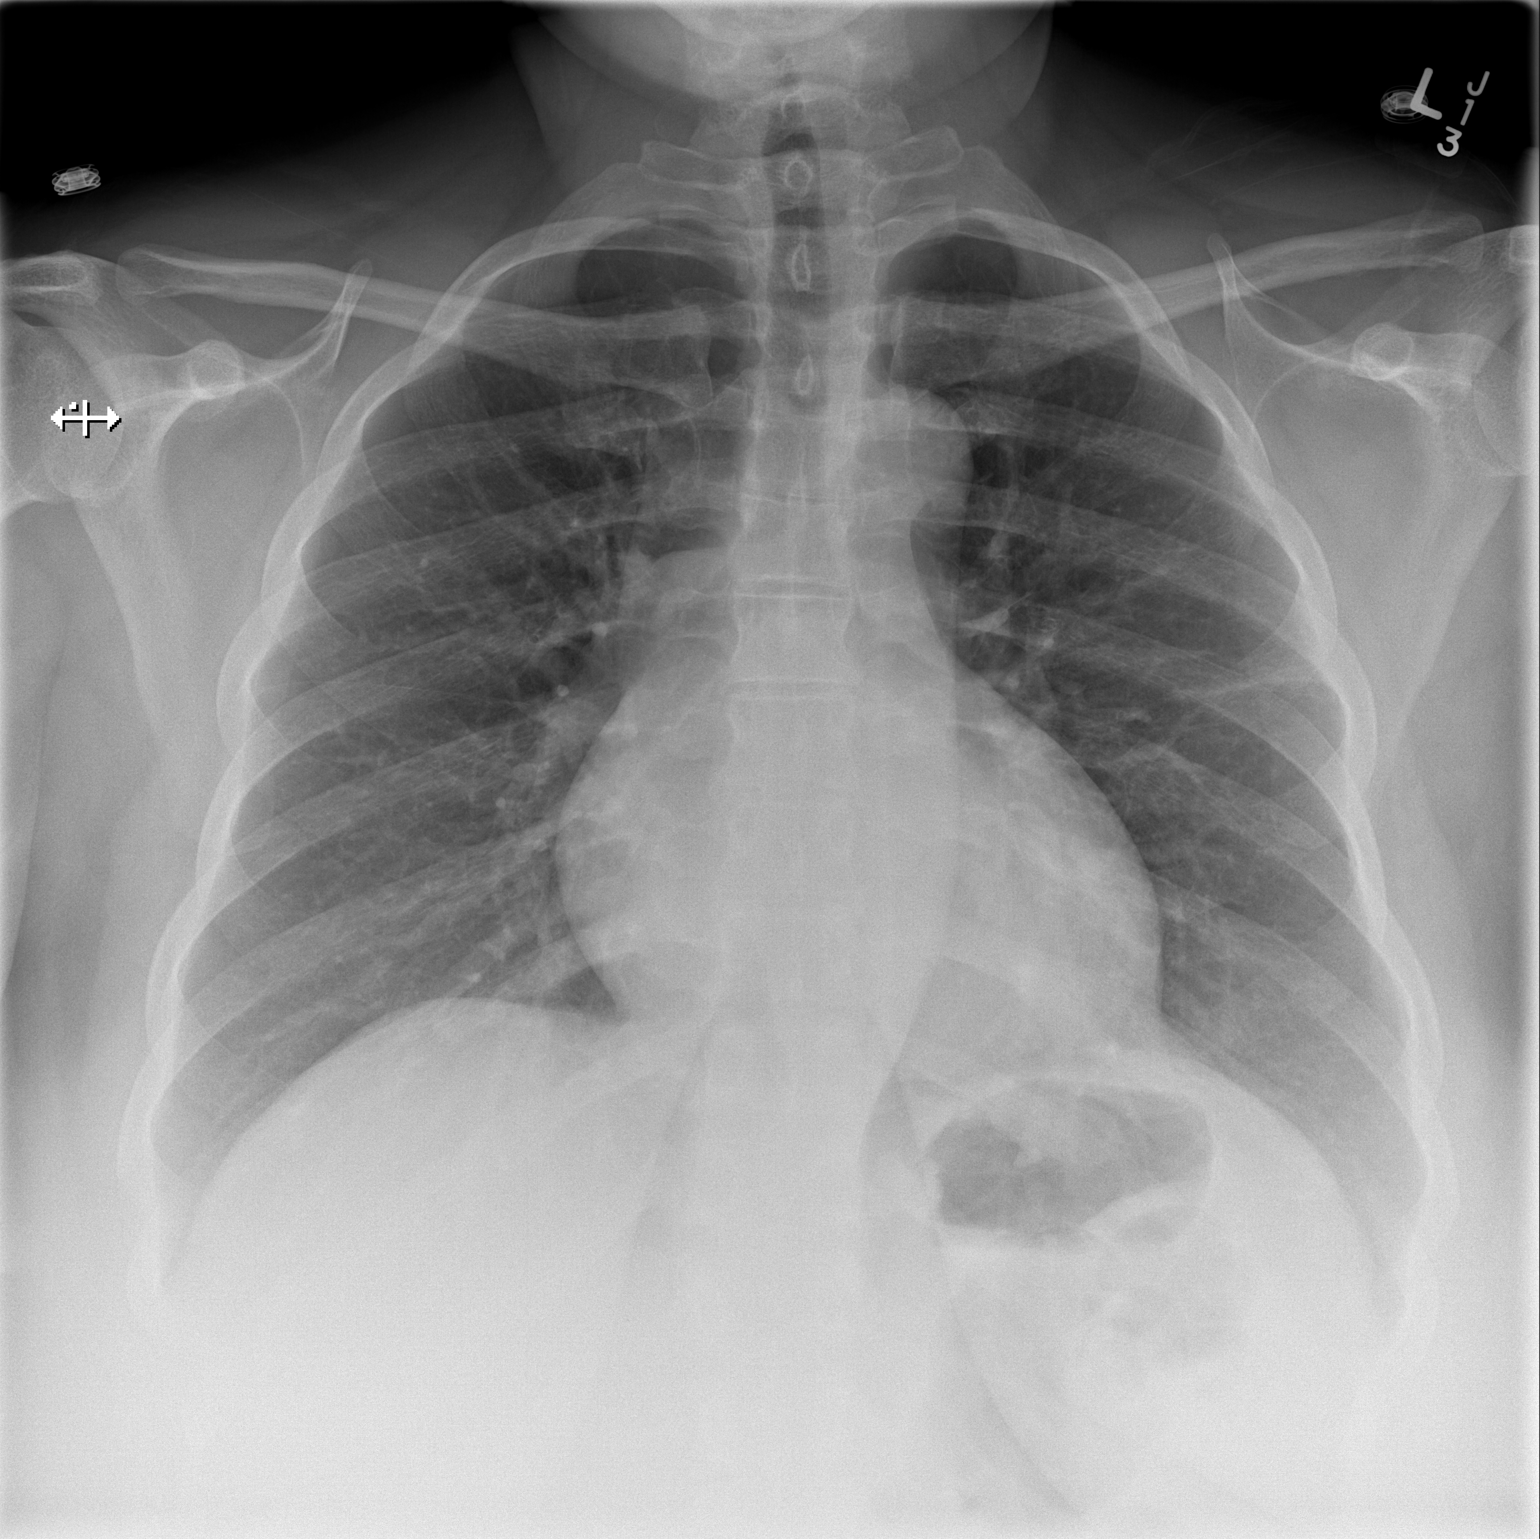

[w chest lat]
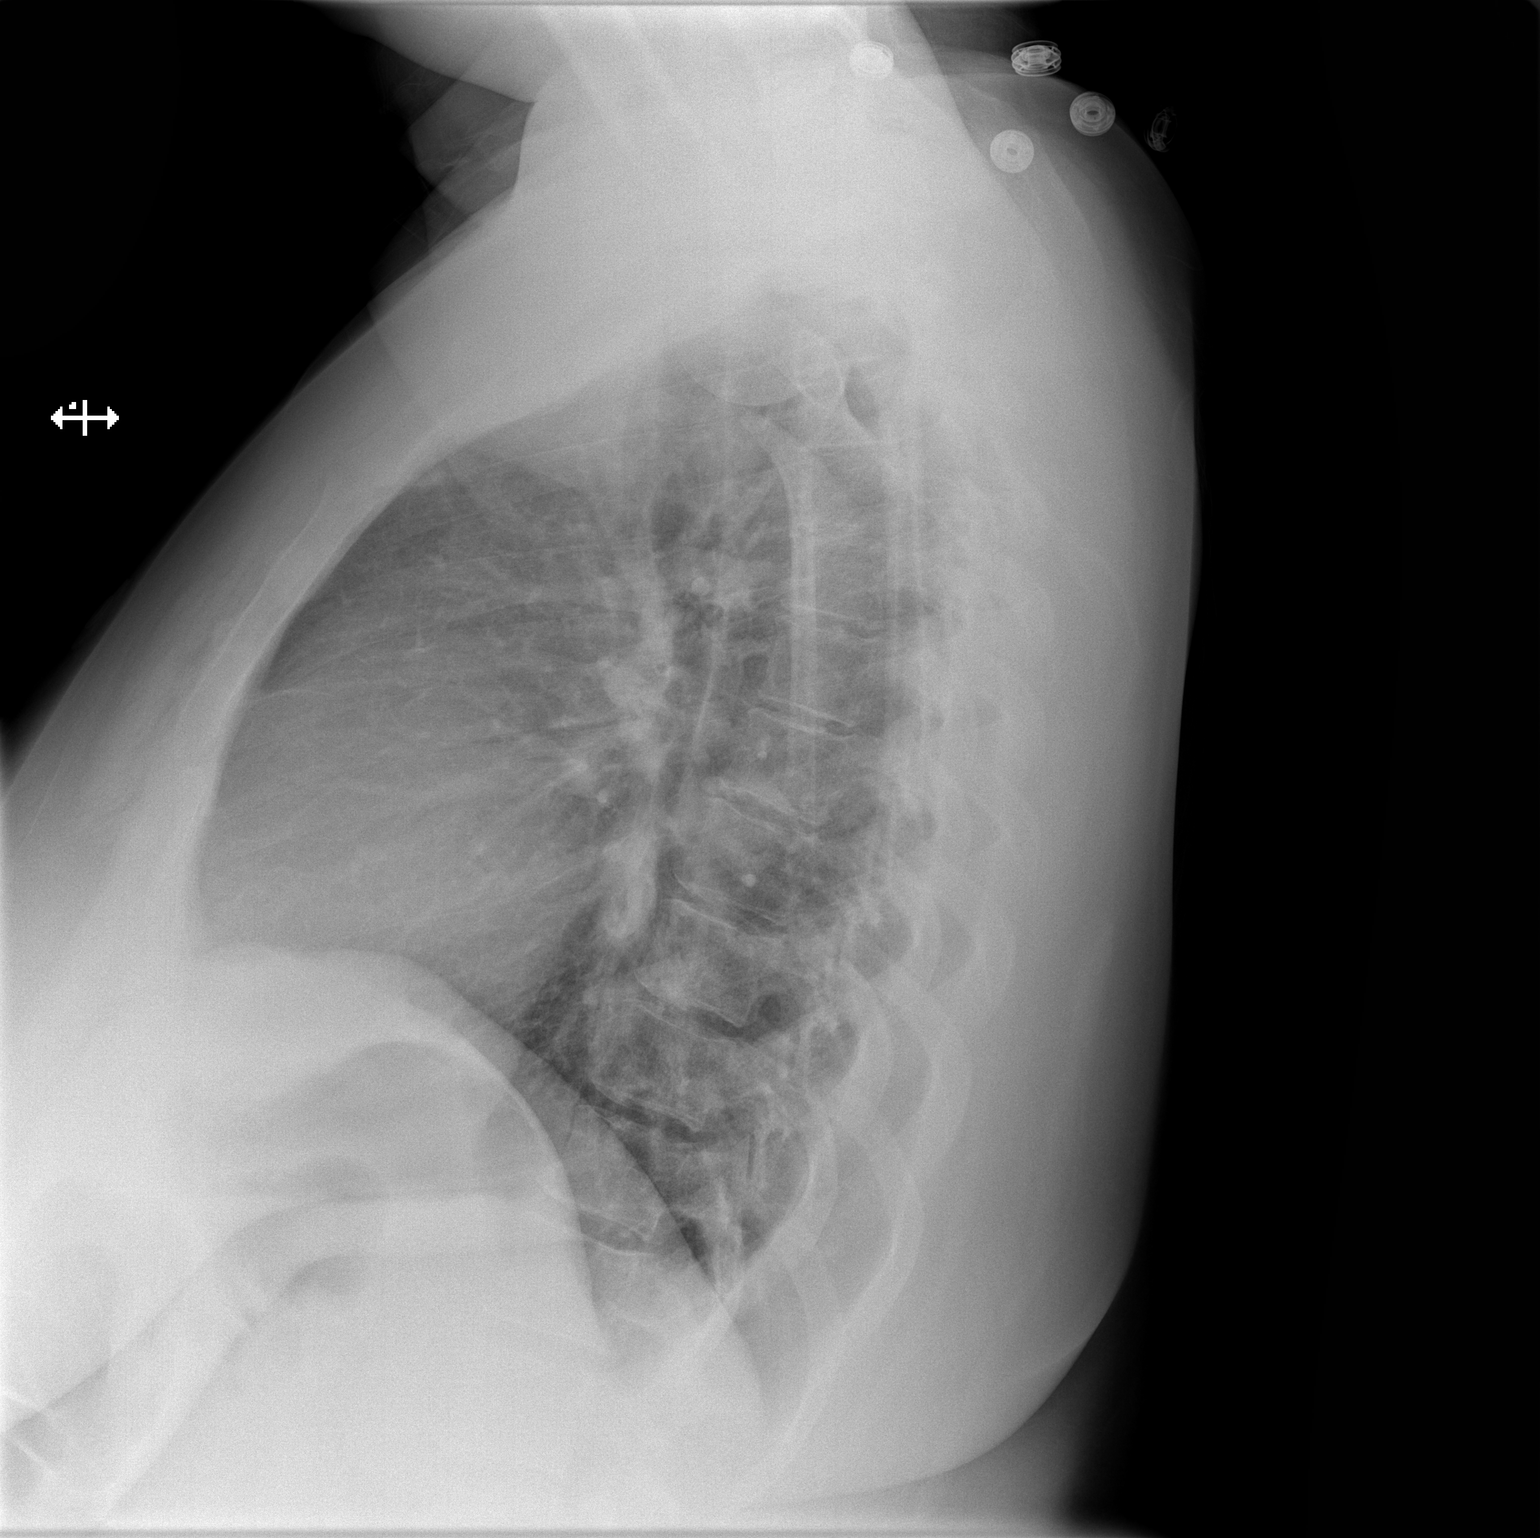

[2 of 2 positions shown; findings below may reference images not displayed]

FINDINGS: [The lungs are well-aerated and free from pulmonary
edema, focal airspace consolidation or pulmonary nodule.  Cardiac
and mediastinal contours are within normal limits.  No
pneumothorax, or pleural effusion. No acute osseous findings.]
IMPRESSION: No acute cardiopulmonary disease.

## 2014-04-19 IMAGING — US US ABDOMEN COMPLETE
1 series · 13 of 25 positions shown · non-contrast
Comparison: None

CLINICAL DATA: Abdominal pain.

ABDOMEN ULTRASOUND
TECHNIQUE: Complete abdominal ultrasound examination was performed
including evaluation of the liver, gallbladder, bile ducts,
pancreas, kidneys, spleen, IVC, and abdominal aorta.

[Series 1: us abdomen complete · 0.35mm/px · 13 of 66 slices shown]
[im 1/66]
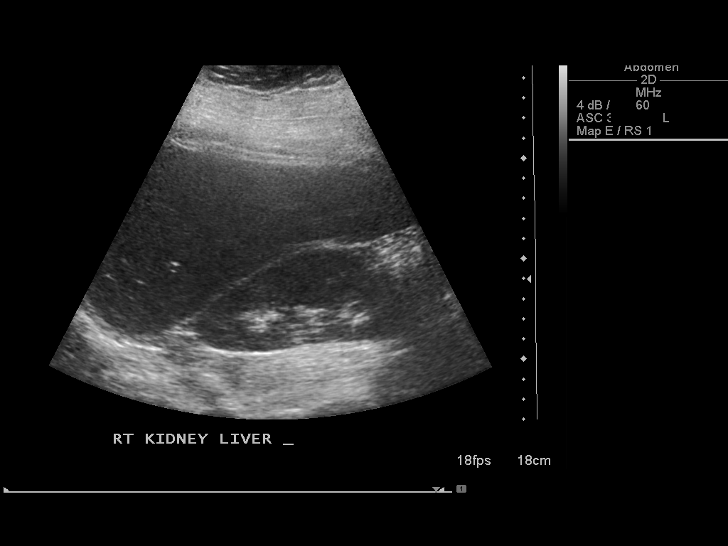
[im 6/66]
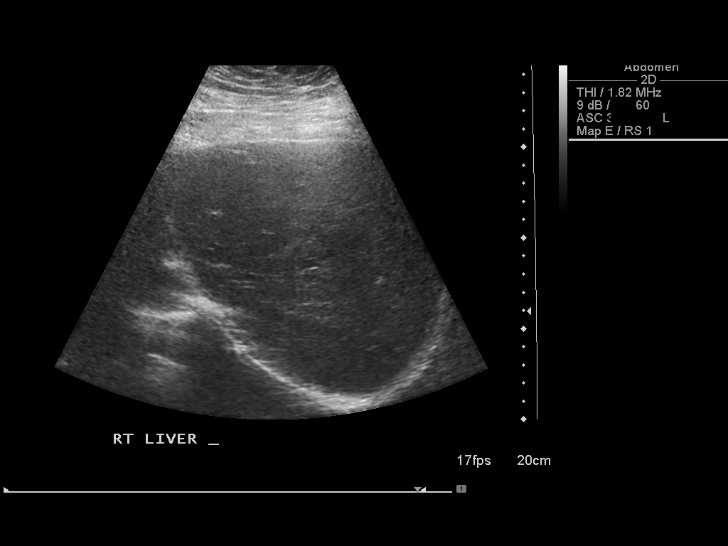
[im 11/66]
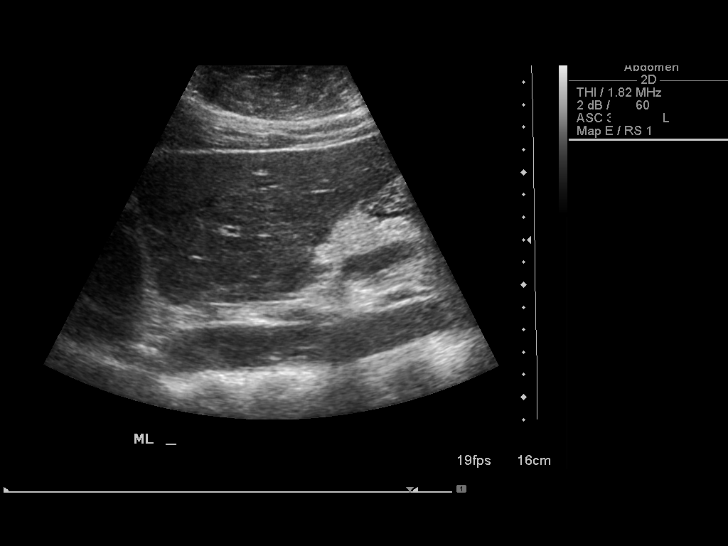
[im 17/66]
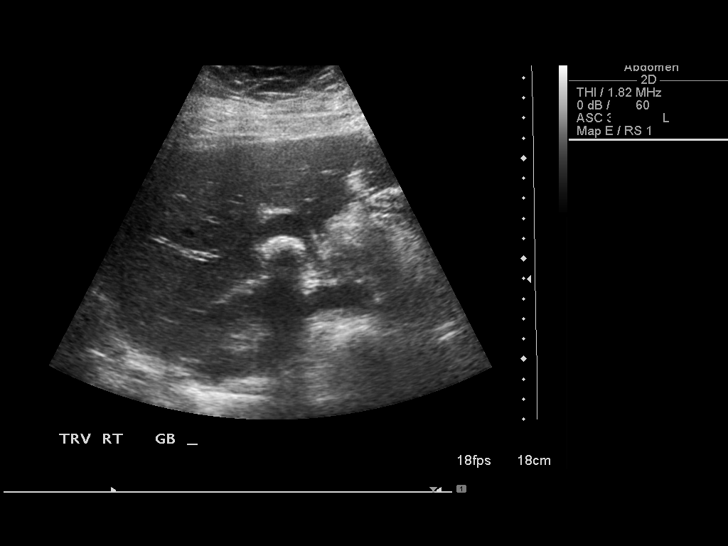
[im 22/66]
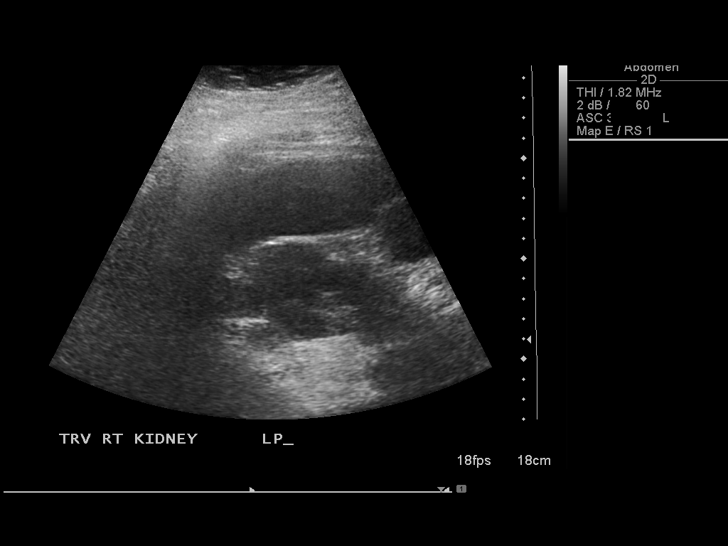
[im 28/66]
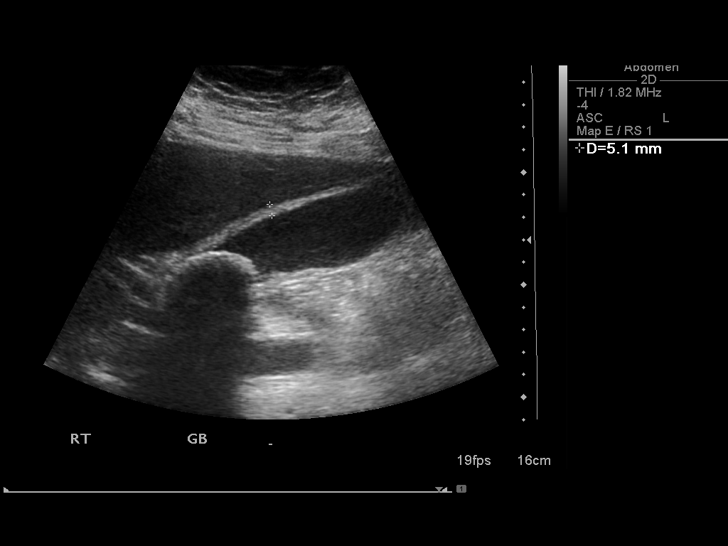
[im 33/66]
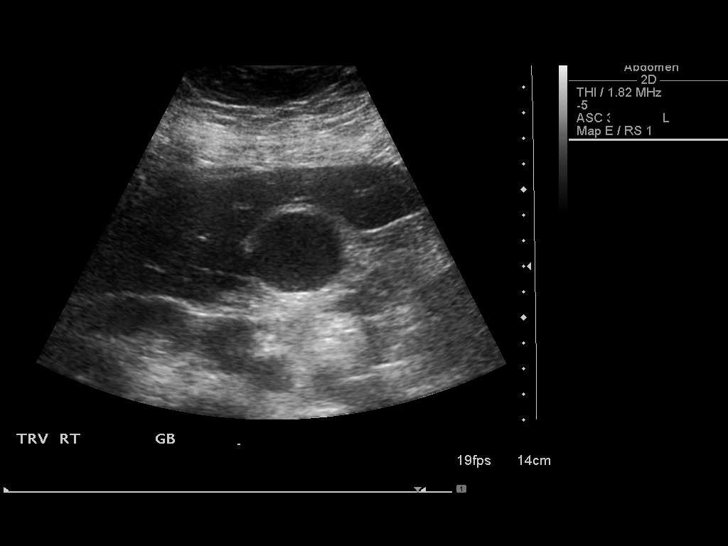
[im 38/66]
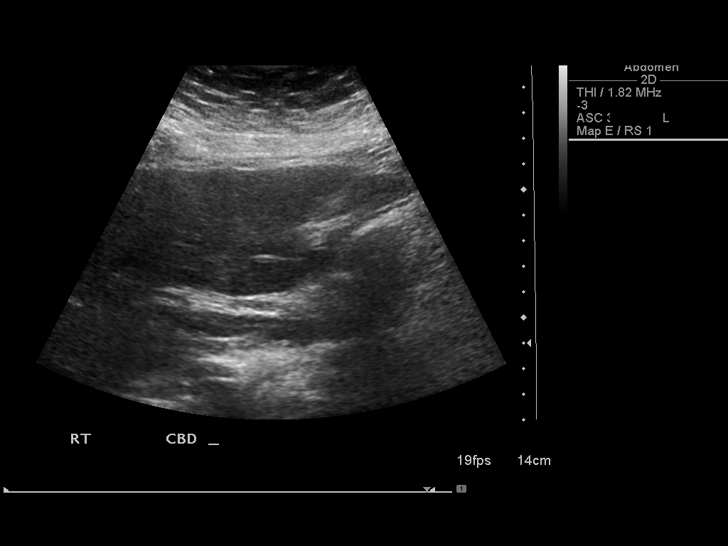
[im 44/66]
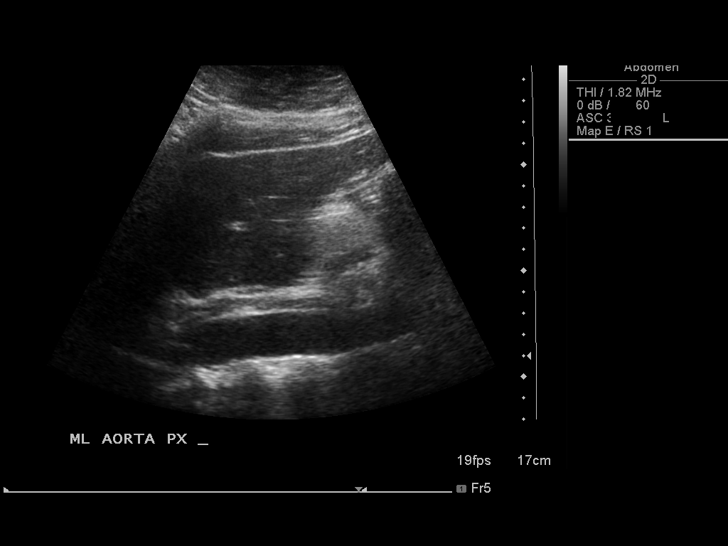
[im 49/66]
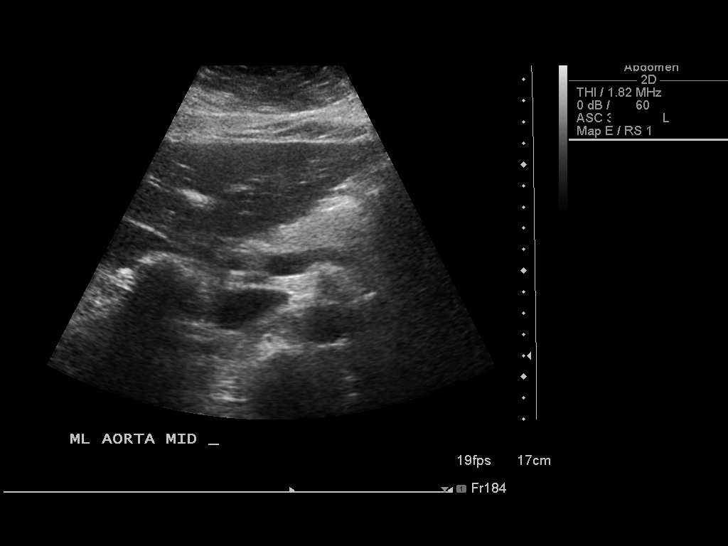
[im 55/66]
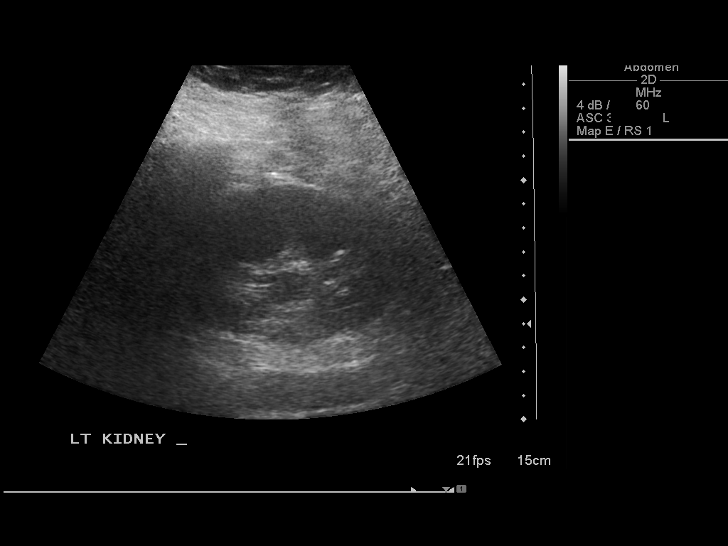
[im 60/66]
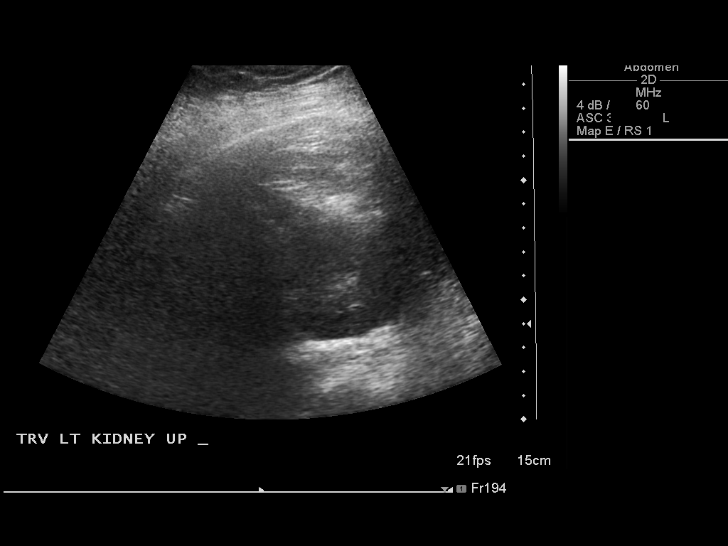
[im 66/66]
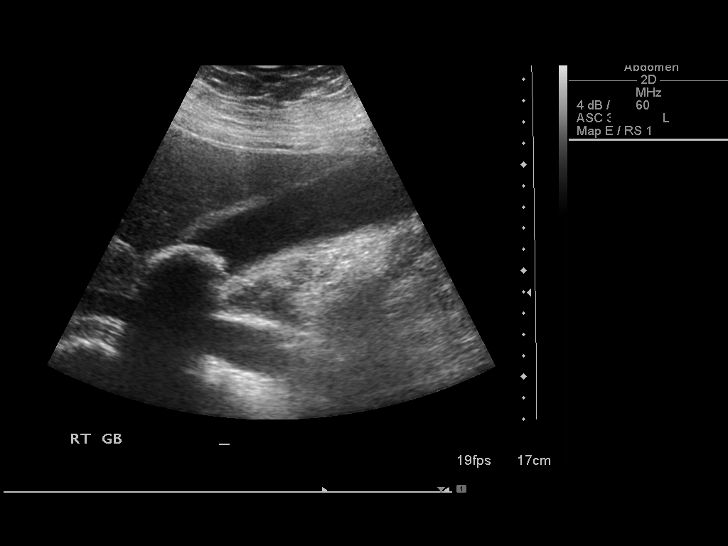

[13 of 25 positions shown; findings below may reference images not displayed]

FINDINGS: Gallbladder:  There is a dominant, shadowing gallstone located in
the neck of the gallbladder and likely measuring over 3 cm in
greatest diameter.  There is suggestion of also potentially some
sludge and small additional calculi in the gallbladder.  There is
no evidence of overt gallbladder wall thickening or sonographic
Murphy's sign.

Common Bile Duct:  Normal caliber with measured diameter of 4 mm.

Liver:  No focal mass lesion seen.  Within normal limits in
parenchymal echogenicity.

IVC:  Patent throughout its visualized course in the abdomen.

Pancreas:  Although the pancreas is difficult to visualize in its
entirety, no focal pancreatic abnormality is identified.

Spleen:  The spleen is of normal echotexture and size.

Kidneys:  The right kidney measures 11.8 cm and the left kidney
10.3 cm.  Both have a normal sonographic appearance.

Abdominal Aorta:  Visualized abdominal aorta is of normal caliber.
The distal aorta is not visualized.
IMPRESSION: Cholelithiasis with a large dominant shadowing gallstone identified
in the neck of the gallbladder.  No overt associated evidence by
ultrasound of cholecystitis or biliary obstruction.

## 2014-04-20 IMAGING — RF DG CHOLANGIOGRAM OPERATIVE
1 series · 8 of 8 positions shown · non-contrast
Comparison: None.

CLINICAL DATA: Cholecystectomy

INTRAOPERATIVE CHOLANGIOGRAM
TECHNIQUE: Multiple fluoroscopic spot radiographs were obtained
during intraoperative cholangiogram and are submitted for
interpretation post-operatively.

[Series 1: run · 2 acquisitions, 8 frames shown]
[im 1/2]
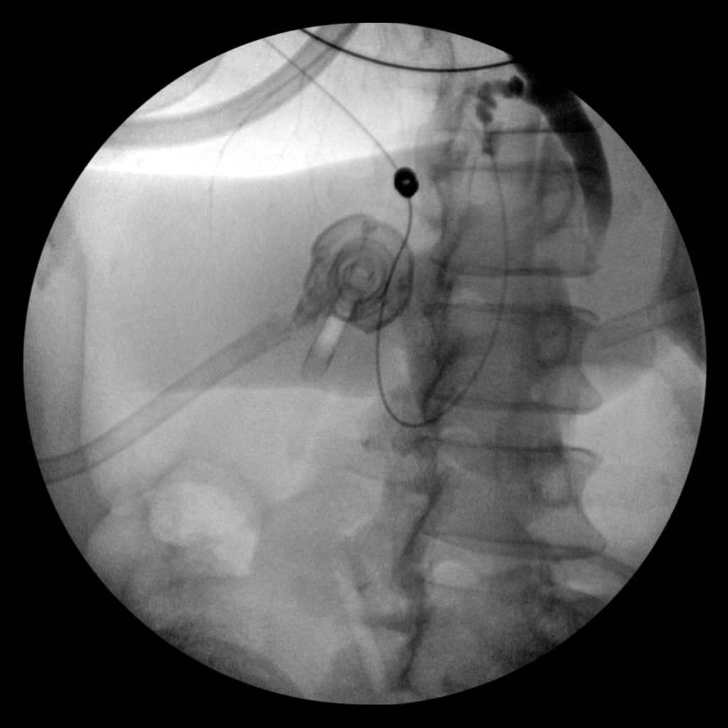
[im 1/2]
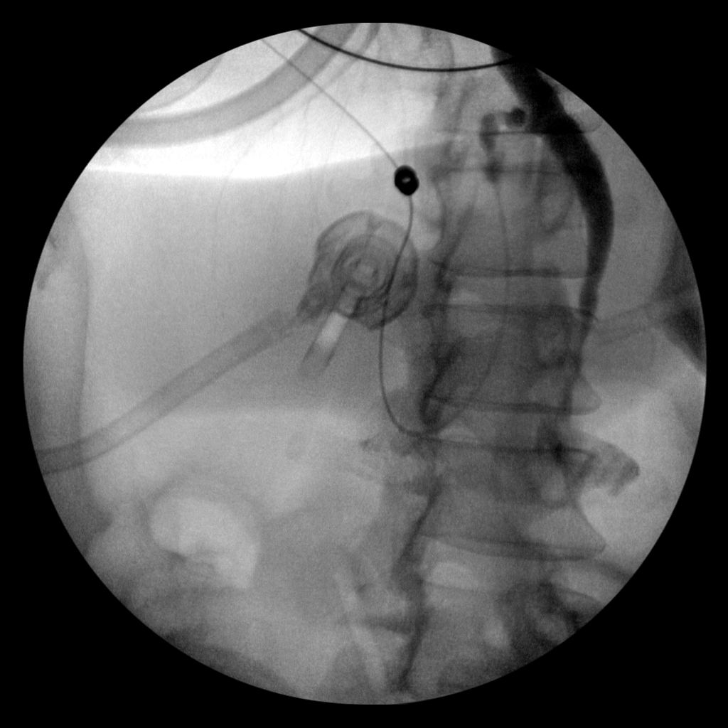
[im 1/2]
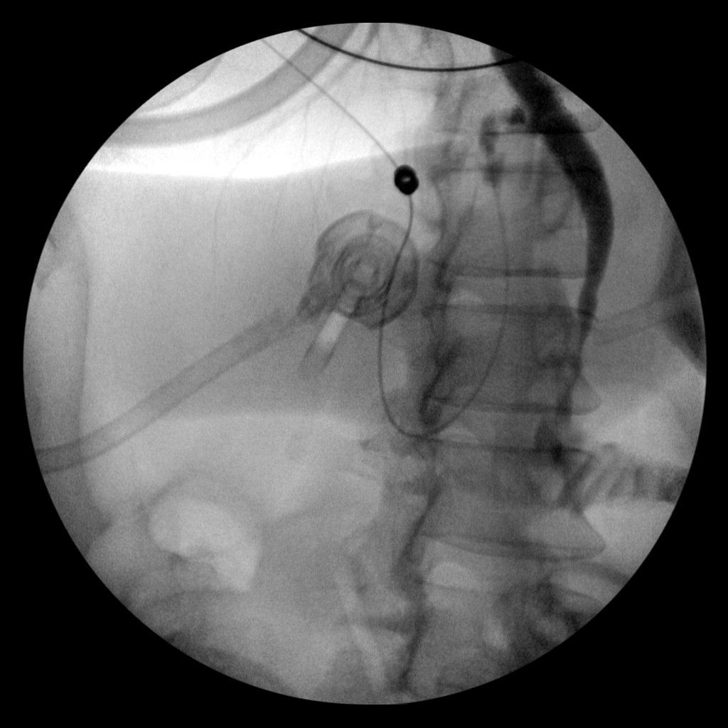
[im 1/2]
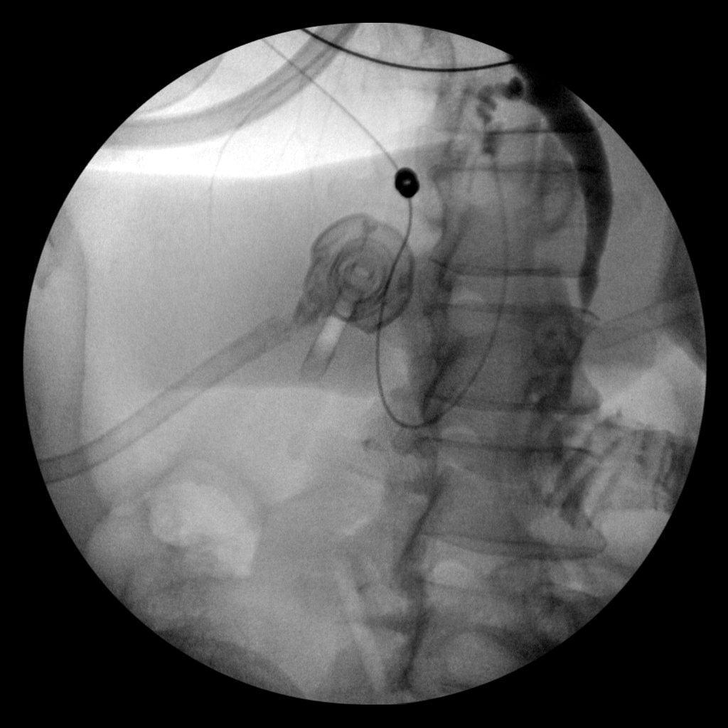
[im 2/2]
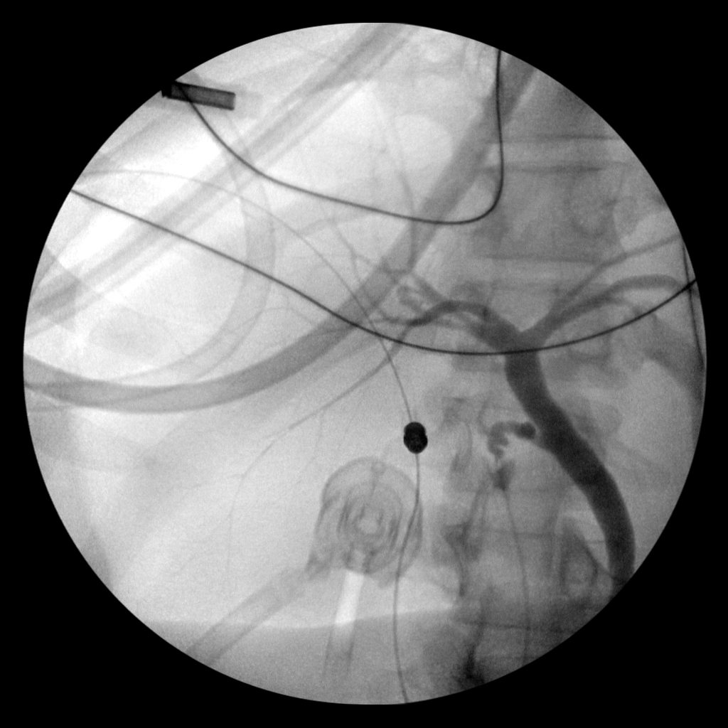
[im 2/2]
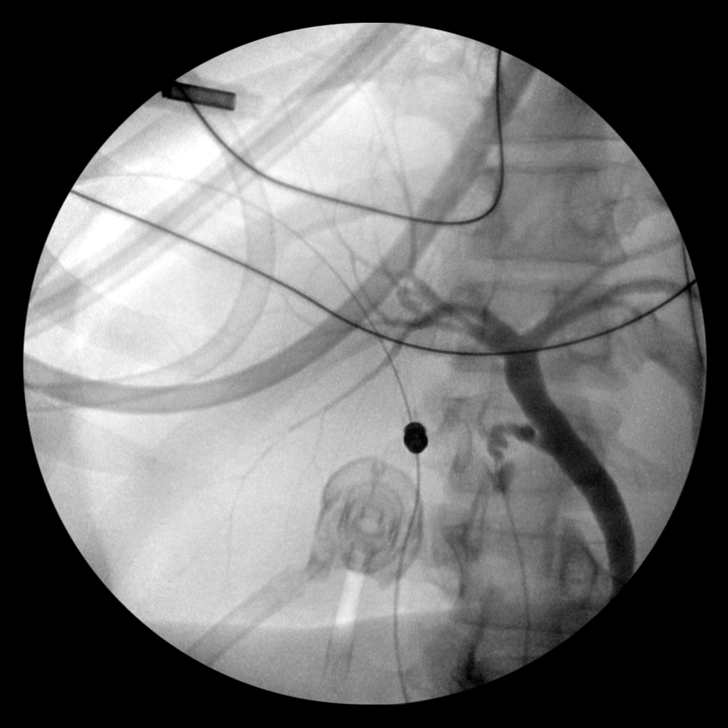
[im 2/2]
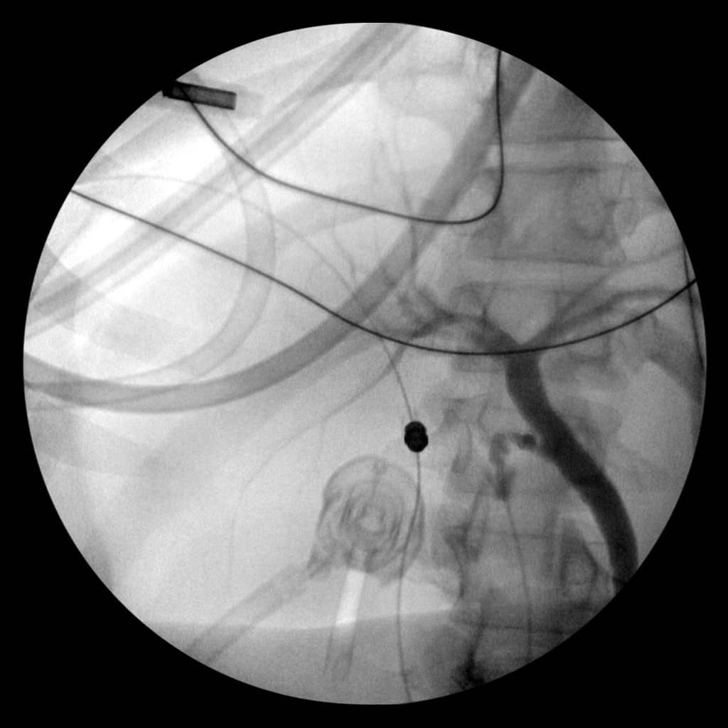
[im 2/2]
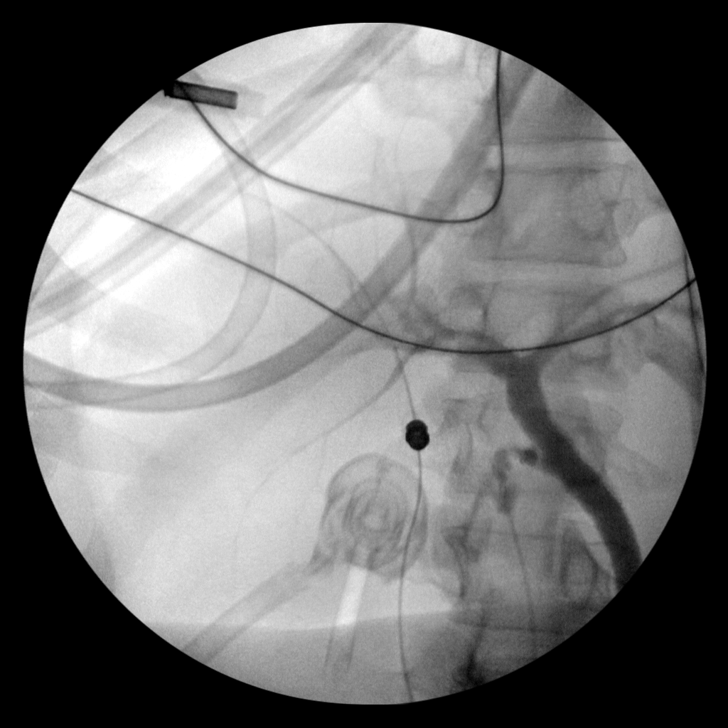

[8 of 8 positions shown; findings below may reference images not displayed]

FINDINGS: No persistent filling defects in the common duct.
Intrahepatic ducts are incompletely visualized, appearing
decompressed centrally. Contrast passes into the duodenum.

IMPRESSION

Negative for retained common duct stone.

## 2014-05-21 ENCOUNTER — Emergency Department (HOSPITAL_BASED_OUTPATIENT_CLINIC_OR_DEPARTMENT_OTHER)
Admission: EM | Admit: 2014-05-21 | Discharge: 2014-05-21 | Disposition: A | Discharge status: Home / Self Care | Payer: Medicaid Other | Attending: Emergency Medicine | Admitting: Emergency Medicine

## 2014-05-21 ENCOUNTER — Encounter (HOSPITAL_BASED_OUTPATIENT_CLINIC_OR_DEPARTMENT_OTHER): Payer: Self-pay | Admitting: Emergency Medicine

## 2014-05-21 DIAGNOSIS — IMO0002 Reserved for concepts with insufficient information to code with codable children: Secondary | ICD-10-CM | POA: Diagnosis present

## 2014-05-21 DIAGNOSIS — X503XXA Overexertion from repetitive movements, initial encounter: Secondary | ICD-10-CM | POA: Insufficient documentation

## 2014-05-21 DIAGNOSIS — S335XXA Sprain of ligaments of lumbar spine, initial encounter: Secondary | ICD-10-CM | POA: Diagnosis not present

## 2014-05-21 DIAGNOSIS — Y929 Unspecified place or not applicable: Secondary | ICD-10-CM | POA: Insufficient documentation

## 2014-05-21 DIAGNOSIS — Y9389 Activity, other specified: Secondary | ICD-10-CM | POA: Diagnosis not present

## 2014-05-21 DIAGNOSIS — F17200 Nicotine dependence, unspecified, uncomplicated: Secondary | ICD-10-CM | POA: Diagnosis not present

## 2014-05-21 DIAGNOSIS — S39012A Strain of muscle, fascia and tendon of lower back, initial encounter: Secondary | ICD-10-CM

## 2014-05-21 DIAGNOSIS — Z8719 Personal history of other diseases of the digestive system: Secondary | ICD-10-CM | POA: Diagnosis not present

## 2014-05-21 NOTE — ED Provider Notes (Signed)
CSN: [id number]     Arrival date & time 05/21/14  2136 History  This chart was scribed for Babette Relic, MD by Lowella Petties, ED Scribe. The patient was seen in room MH06/MH06. Patient's care was started at 10:47 PM.     Chief Complaint  Patient presents with  . Back Pain   HPI HPI Comments: Sarah Mueller is a 38 y.o. female who presents to the Emergency Department complaining of positional constant moderate back pain onset 2 days ago. She reports that pain is exacerbated by movement. She states that after lifting something heavy while moving this weekend she strained her back, and she is here requesting a work note. She denies fever, nausea, vomiting, weakness, numbness, change in B/B fxn. She reports baseline, chronic right hip pain. Past Medical History  Diagnosis Date  . Pancreatitis 03/30/2013   Past Surgical History  Procedure Laterality Date  . Cesarean section  2005  . Dilation and curettage of uterus  ~ 2000  . Cholecystectomy N/A 03/31/2013    Procedure: LAPAROSCOPIC CHOLECYSTECTOMY WITH INTRAOPERATIVE CHOLANGIOGRAM;  Surgeon: Ralene Ok, MD;  Location: [facility];  Service: General;  Laterality: N/A;   History reviewed. No pertinent family history. History  Substance Use Topics  . Smoking status: Current Every Day Smoker -- 0.33 packs/day for 15 years    Types: Cigarettes  . Smokeless tobacco: Never Used     Comment: 03/30/2013 offered smoking cessation materials; pt declines  . Alcohol Use: 1.2 oz/week    1 Glasses of wine, 1 Shots of liquor per week     Comment: 05/20/2013 "once or twice/wk I'll have a beer or glass of wine or mixed drink"   OB History   Grav Para Term Preterm Abortions TAB SAB Ect Mult Living                 Review of Systems  10 Systems reviewed and are negative for acute change except as noted in the HPI.   Allergies  Review of patient's allergies indicates no known allergies.  Home Medications   Prior to Admission medications   Not  on File   Triage Vitals: BP 135/104  Pulse 94  Temp(Src) 98.2 F (36.8 C) (Oral)  Resp 18  Ht 5\' 6"  (1.676 m)  Wt 240 lb (108.863 kg)  BMI 38.76 kg/m2  SpO2 98%  LMP 05/14/2014 Physical Exam  Nursing note and vitals reviewed. Constitutional:  Awake, alert, nontoxic appearance with baseline speech.  HENT:  Head: Atraumatic.  Eyes: Pupils are equal, round, and reactive to light. Right eye exhibits no discharge. Left eye exhibits no discharge.  Neck: Neck supple.  Cardiovascular: Normal rate and regular rhythm.   No murmur heard. Pulmonary/Chest: Effort normal and breath sounds normal. No respiratory distress. She has no wheezes. She has no rales. She exhibits no tenderness.  Abdominal: Soft. Bowel sounds are normal. She exhibits no mass. There is no tenderness. There is no rebound.  Musculoskeletal:       Thoracic back: She exhibits no tenderness.       Lumbar back: She exhibits no tenderness.  Diffuse lumbar tenderness. Bilateral lower extremities non tender without new rashes or color change, baseline ROM with intact DP pulses, CR<2 secs all digits bilaterally, sensation baseline light touch bilaterally for pt, DTR's symmetric and intact bilaterally KJ / AJ, motor symmetric bilateral 5 / 5 hip flexion, quadriceps, hamstrings, EHL, foot dorsiflexion, foot plantarflexion, gait somewhat antalgic but without apparent new ataxia.  Neurological:  Mental status baseline for patient.  Upper extremity motor strength and sensation intact and symmetric bilaterally.  Skin: No rash noted.  Psychiatric: She has a normal mood and affect.    ED Course  Procedures (including critical care time) DIAGNOSTIC STUDIES: Oxygen Saturation is 98% on room air, normal by my interpretation.    COORDINATION OF CARE: Patient / Family / Caregiver understand and agree with initial ED impression and plan with expectations set for ED visit.  Labs Review Labs Reviewed - No data to display  Imaging  Review No results found.   EKG Interpretation None      MDM   Final diagnoses:  Lumbar strain, initial encounter    I doubt any other EMC precluding discharge at this time including, but not necessarily limited to the following:cauda equina syndrome, SBI.  I personally performed the services described in this documentation, which was scribed in my presence. The recorded information has been reviewed and is accurate.    Babette Relic, MD 05/22/14 2119

## 2014-05-21 NOTE — ED Notes (Signed)
Pt reports moving furniture this weekend-lower back pain since.  Ambulatory.

## 2014-05-21 NOTE — Discharge Instructions (Signed)
SEEK IMMEDIATE MEDICAL ATTENTION IF: New numbness, tingling, weakness, or problem with the use of your arms or legs.  Severe back pain not relieved with medications.  Change in bowel or bladder control.  Increasing pain in any areas of the body (such as chest or abdominal pain).  Shortness of breath, dizziness or fainting.  Nausea (feeling sick to your stomach), vomiting, fever, or sweats.  

## 2014-05-21 NOTE — ED Notes (Signed)
Pt discharged to home with family. NAD.  

## 2014-05-26 ENCOUNTER — Encounter (HOSPITAL_COMMUNITY): Payer: Self-pay | Admitting: Emergency Medicine

## 2014-05-26 ENCOUNTER — Emergency Department (HOSPITAL_COMMUNITY)
Admission: EM | Admit: 2014-05-26 | Discharge: 2014-05-26 | Disposition: A | Discharge status: Home / Self Care | Payer: Medicaid Other | Attending: Emergency Medicine | Admitting: Emergency Medicine

## 2014-05-26 DIAGNOSIS — R110 Nausea: Secondary | ICD-10-CM | POA: Insufficient documentation

## 2014-05-26 DIAGNOSIS — R51 Headache: Secondary | ICD-10-CM | POA: Diagnosis present

## 2014-05-26 DIAGNOSIS — F17200 Nicotine dependence, unspecified, uncomplicated: Secondary | ICD-10-CM | POA: Diagnosis not present

## 2014-05-26 DIAGNOSIS — R519 Headache, unspecified: Secondary | ICD-10-CM

## 2014-05-26 MED ORDER — ASPIRIN-ACETAMINOPHEN-CAFFEINE 250-250-65 MG PO TABS
2.0000 | ORAL_TABLET | Freq: Once | ORAL | Status: AC
  Administered 2014-05-26: 2 via ORAL
  Filled 2014-05-26: qty 2

## 2014-05-26 MED ORDER — FLUTICASONE PROPIONATE 50 MCG/ACT NA SUSP: 2.0000 | Freq: Every day | NASAL | Status: DC

## 2014-05-26 MED ORDER — ASPIRIN-ACETAMINOPHEN-CAFFEINE 250-250-65 MG PO TABS: 2.0000 | ORAL_TABLET | Freq: Four times a day (QID) | ORAL | Status: DC | PRN

## 2014-05-26 MED ORDER — ONDANSETRON 4 MG PO TBDP
4.0000 mg | ORAL_TABLET | Freq: Once | ORAL | Status: AC
Start: 2014-05-26 — End: 2014-05-26
  Administered 2014-05-26: 4 mg via ORAL
  Filled 2014-05-26: qty 1

## 2014-05-26 NOTE — ED Provider Notes (Signed)
CSN: [id number]     Arrival date & time 05/26/14  1215 History  This chart was scribed for non-physician practitioner, Noland Fordyce, PA-C,working with Carmin Muskrat, MD, by Marlowe Kays, ED Scribe.  This patient was seen in room TR06C/TR06C and the patient's care was started at 12:26 PM.  Chief Complaint  Patient presents with  . Facial Pain   The history is provided by the patient. No language interpreter was used.   HPI Comments:  Sarah Mueller is a 38 y.o. obese female who presents to the Emergency Department complaining of a sharp, shooting pain behind her right eye that started earlier today. She reports associated sinus tenderness of the right side. She reports associated nausea. She states she had similar symptoms two days ago that lasted only about one hour. She states she took Tylenol at that time and the pain went away. She denies fever, vomiting, cough, congestion, or rhinorrhea. Pt denies seasonal allergies. She does not wear contact lenses or corrective lenses. She denies any medical issues.   Past Medical History  Diagnosis Date  . Pancreatitis 03/30/2013   Past Surgical History  Procedure Laterality Date  . Cesarean section  2005  . Dilation and curettage of uterus  ~ 2000  . Cholecystectomy N/A 03/31/2013    Procedure: LAPAROSCOPIC CHOLECYSTECTOMY WITH INTRAOPERATIVE CHOLANGIOGRAM;  Surgeon: Ralene Ok, MD;  Location: [facility];  Service: General;  Laterality: N/A;   No family history on file. History  Substance Use Topics  . Smoking status: Current Every Day Smoker -- 0.33 packs/day for 15 years    Types: Cigarettes  . Smokeless tobacco: Never Used     Comment: 03/30/2013 offered smoking cessation materials; pt declines  . Alcohol Use: 1.2 oz/week    1 Glasses of wine, 1 Shots of liquor per week     Comment: 05/20/2013 "once or twice/wk I'll have a beer or glass of wine or mixed drink"   OB History   Grav Para Term Preterm Abortions TAB SAB Ect Mult Living                  Review of Systems  Constitutional: Negative for fever.  HENT: Positive for sinus pressure. Negative for congestion and rhinorrhea.   Respiratory: Negative for cough.   Gastrointestinal: Positive for nausea. Negative for vomiting.  Neurological: Positive for headaches.  All other systems reviewed and are negative.   Allergies  Review of patient's allergies indicates no known allergies.  Home Medications   Prior to Admission medications   Medication Sig Start Date End Date Taking? Authorizing Provider  aspirin-acetaminophen-caffeine (EXCEDRIN MIGRAINE) [phone] MG per tablet Take 2 tablets by mouth every 6 (six) hours as needed for headache or migraine. 05/26/14   Noland Fordyce, PA-C  fluticasone (FLONASE) 50 MCG/ACT nasal spray Place 2 sprays into both nostrils daily. 05/26/14   Noland Fordyce, PA-C   Triage Vitals: BP 128/90  Pulse 90  Temp(Src) 98.6 F (37 C) (Oral)  SpO2 99%  LMP 05/14/2014 Physical Exam  Nursing note and vitals reviewed. Constitutional: She is oriented to person, place, and time. She appears well-developed and well-nourished.  Dark in exam room with pt lying holding right side of face.  HENT:  Head: Normocephalic and atraumatic.  Right Ear: Tympanic membrane normal.  Nose: Mucosal edema present. Right sinus exhibits maxillary sinus tenderness and frontal sinus tenderness.  Right nasal mucosa edema. No ecchymosis, no congestion.  Eyes: Conjunctivae and EOM are normal. Pupils are equal, round, and reactive to  light.  Neck: Normal range of motion. Neck supple.  Cardiovascular: Normal rate.   Pulmonary/Chest: Effort normal.  Musculoskeletal: Normal range of motion.  Neurological: She is alert and oriented to person, place, and time. No cranial nerve deficit. Coordination normal.  Skin: Skin is warm and dry.  Psychiatric: She has a normal mood and affect. Her behavior is normal.    ED Course  Procedures (including critical care  time) DIAGNOSTIC STUDIES: Oxygen Saturation is 99% on RA, normal by my interpretation.   COORDINATION OF CARE: 12:29 PM- Will prescribe nasal spray and order Excedrin and Zofran prior to discharge. Pt verbalizes understanding and agrees to plan.  Medications  aspirin-acetaminophen-caffeine (EXCEDRIN MIGRAINE) per tablet 2 tablet (2 tablets Oral Given 05/26/14 1304)  ondansetron (ZOFRAN-ODT) disintegrating tablet 4 mg (4 mg Oral Given 05/26/14 1242)    Labs Review Labs Reviewed - No data to display  Imaging Review No results found.   EKG Interpretation None      MDM   Final diagnoses:  Sinus headache   Pt is a 38yo female presenting to ED with right sided headache consistent with a since headache. Normal neuro exam. Frontal and maxillary sinus tenderness with right nasal mucosa edema.  Pt given Excedrin and zofran, allowed to rest in darkened room. Headache resolved while pt was in ED. Discharged home with Excedrin. Advised to f/u with PCP.  Return precautions provided. Pt verbalized understanding and agreement with tx plan.   I personally performed the services described in this documentation, which was scribed in my presence. The recorded information has been reviewed and is accurate.    Noland Fordyce, PA-C 05/26/14 1542

## 2014-05-26 NOTE — ED Notes (Signed)
Pt c/o "sinus pain", pain behind right eye, worse with inhaling through nose, nasal congestions.

## 2014-05-26 NOTE — ED Notes (Signed)
Waiting for med from pharmacy

## 2014-05-28 NOTE — ED Provider Notes (Signed)
  Medical screening examination/treatment/procedure(s) were performed by non-physician practitioner and as supervising physician I was immediately available for consultation/collaboration.   EKG Interpretation None         Carmin Muskrat, MD 05/28/14 1526

## 2016-01-21 ENCOUNTER — Encounter (HOSPITAL_COMMUNITY): Payer: Self-pay | Admitting: *Deleted

## 2016-01-21 ENCOUNTER — Inpatient Hospital Stay (HOSPITAL_COMMUNITY)
Admission: AD | Admit: 2016-01-21 | Discharge: 2016-01-21 | Disposition: A | Discharge status: Home / Self Care | Payer: Medicaid Other | Source: Ambulatory Visit | Attending: Family Medicine | Admitting: Family Medicine

## 2016-01-21 DIAGNOSIS — O09522 Supervision of elderly multigravida, second trimester: Secondary | ICD-10-CM | POA: Diagnosis not present

## 2016-01-21 DIAGNOSIS — Z3490 Encounter for supervision of normal pregnancy, unspecified, unspecified trimester: Secondary | ICD-10-CM

## 2016-01-21 DIAGNOSIS — O0932 Supervision of pregnancy with insufficient antenatal care, second trimester: Secondary | ICD-10-CM | POA: Insufficient documentation

## 2016-01-21 DIAGNOSIS — Z3A24 24 weeks gestation of pregnancy: Secondary | ICD-10-CM | POA: Diagnosis not present

## 2016-01-21 DIAGNOSIS — O9989 Other specified diseases and conditions complicating pregnancy, childbirth and the puerperium: Secondary | ICD-10-CM | POA: Diagnosis not present

## 2016-01-21 DIAGNOSIS — R109 Unspecified abdominal pain: Secondary | ICD-10-CM | POA: Insufficient documentation

## 2016-01-21 DIAGNOSIS — O26892 Other specified pregnancy related conditions, second trimester: Secondary | ICD-10-CM | POA: Insufficient documentation

## 2016-01-21 DIAGNOSIS — Z87891 Personal history of nicotine dependence: Secondary | ICD-10-CM | POA: Insufficient documentation

## 2016-01-21 DIAGNOSIS — Z8249 Family history of ischemic heart disease and other diseases of the circulatory system: Secondary | ICD-10-CM | POA: Insufficient documentation

## 2016-01-21 DIAGNOSIS — M549 Dorsalgia, unspecified: Secondary | ICD-10-CM | POA: Diagnosis not present

## 2016-01-21 DIAGNOSIS — L299 Pruritus, unspecified: Secondary | ICD-10-CM | POA: Diagnosis present

## 2016-01-21 HISTORY — DX: Depression, unspecified: F32.A

## 2016-01-21 HISTORY — DX: Unspecified infectious disease: B99.9

## 2016-01-21 HISTORY — DX: Major depressive disorder, single episode, unspecified: F32.9

## 2016-01-21 HISTORY — DX: Chlamydial infection, unspecified: A74.9

## 2016-01-21 LAB — URINALYSIS, ROUTINE W REFLEX MICROSCOPIC
BILIRUBIN URINE: NEGATIVE
Glucose, UA: NEGATIVE mg/dL
KETONES UR: 15 mg/dL — AB
Leukocytes, UA: NEGATIVE
Nitrite: NEGATIVE
Protein, ur: NEGATIVE mg/dL
SPECIFIC GRAVITY, URINE: 1.02 (ref 1.005–1.030)
pH: 6 (ref 5.0–8.0)

## 2016-01-21 LAB — ABO/RH: ABO/RH(D): O POS

## 2016-01-21 LAB — URINE MICROSCOPIC-ADD ON

## 2016-01-21 LAB — WET PREP, GENITAL
Clue Cells Wet Prep HPF POC: NONE SEEN
SPERM: NONE SEEN
Trich, Wet Prep: NONE SEEN

## 2016-01-21 LAB — OB RESULTS CONSOLE ABO/RH: RH Type: POSITIVE

## 2016-01-21 LAB — CBC
HCT: 36.6 % (ref 36.0–46.0)
HEMOGLOBIN: 12.4 g/dL (ref 12.0–15.0)
MCH: 31.5 pg (ref 26.0–34.0)
MCHC: 33.9 g/dL (ref 30.0–36.0)
MCV: 92.9 fL (ref 78.0–100.0)
Platelets: 277 10*3/uL (ref 150–400)
RBC: 3.94 MIL/uL (ref 3.87–5.11)
RDW: 13.6 % (ref 11.5–15.5)
WBC: 10.3 10*3/uL (ref 4.0–10.5)

## 2016-01-21 LAB — DIFFERENTIAL
BASOS ABS: 0 10*3/uL (ref 0.0–0.1)
Basophils Relative: 0 %
Eosinophils Absolute: 0.1 10*3/uL (ref 0.0–0.7)
Eosinophils Relative: 1 %
LYMPHS ABS: 2.3 10*3/uL (ref 0.7–4.0)
LYMPHS PCT: 22 %
Monocytes Absolute: 0.3 10*3/uL (ref 0.1–1.0)
Monocytes Relative: 3 %
NEUTROS PCT: 74 %
Neutro Abs: 7.6 10*3/uL (ref 1.7–7.7)

## 2016-01-21 LAB — TYPE AND SCREEN
ABO/RH(D): O POS
ANTIBODY SCREEN: NEGATIVE

## 2016-01-21 LAB — HEPATITIS B SURFACE ANTIGEN: HEP B S AG: NEGATIVE

## 2016-01-21 MED ORDER — MICONAZOLE NITRATE 2 % VA CREA: 1.0000 | TOPICAL_CREAM | Freq: Every day | VAGINAL | Status: DC

## 2016-01-21 NOTE — Discharge Instructions (Signed)

## 2016-01-21 NOTE — MAU Note (Addendum)
Vag itching,  Started yesterday.  Has had lower back pain, and tightness in lower stomach.  Thought she may have overdone it yesterday. Had been constipated, now is having runny stool.  Has not started care yet.

## 2016-01-21 NOTE — MAU Provider Note (Signed)
History     CSN: [id number]  Arrival date and time: 01/21/16 [id number]   First Provider Initiated Contact with Patient 01/21/16 1037      Chief Complaint  Patient presents with  . Vaginal Itching  . Back Pain  . Abdominal Pain   HPI  Sarah Mueller is a 40 yo G3P1012 at [redacted]w[redacted]d (dated by LMP) with no prenatal care who presents with vaginal itching x 2 days. Denies any vaginal discharge, redness, or bleeding. Patient states she has had unprotected sex last week. Denies any LOF or contractions. + FM. Patient states that she has not had prenatal care yet because she cannot decide which clinic to see. She has been taking prenatal vitamins.  Patient also complaining of back pain and abdominal pain that have been ongoing for the last couple weeks. No nausea/vomiting/diarrhea. Both the back and abdominal pain improve with Tylenol and worsen with ambulation.    OB History    Gravida Para Term Preterm AB TAB SAB Ectopic Multiple Living   3 1 1  1 1  0  0 2      Past Medical History  Diagnosis Date  . Pancreatitis 03/30/2013  . Infection     UTI  . Depression     fine now  . Chlamydia     Past Surgical History  Procedure Laterality Date  . Cesarean section  2005  . Dilation and curettage of uterus  ~ 2000  . Cholecystectomy N/A 03/31/2013    Procedure: LAPAROSCOPIC CHOLECYSTECTOMY WITH INTRAOPERATIVE CHOLANGIOGRAM;  Surgeon: Ralene Ok, MD;  Location: [facility];  Service: General;  Laterality: N/A;  . Induced abortion      Family History  Problem Relation Age of Onset  . Hypertension Mother   . Stroke Maternal Grandmother   . Stroke Maternal Grandfather   . Stroke Paternal Grandmother   . Stroke Paternal Grandfather   . Hypertension Paternal Grandfather     Social History  Substance Use Topics  . Smoking status: Former Smoker -- 0.33 packs/day for 15 years    Types: Cigarettes  . Smokeless tobacco: Never Used     Comment: quit early Jan  . Alcohol Use: No    Allergies:  No Known Allergies  Prescriptions prior to admission  Medication Sig Dispense Refill Last Dose  . Ca Carbonate-Mag Hydroxide (ROLAIDS ANTACID ULTRA STRENGTH PO) Take 1 tablet by mouth daily as needed (for heartburn).   01/18/2016  . Prenatal Vit-Fe Fumarate-FA (PRENATAL MULTIVITAMIN) TABS tablet Take 1 tablet by mouth daily at 12 noon.   01/21/2016 at Unknown time  . [DISCONTINUED] aspirin-acetaminophen-caffeine (EXCEDRIN MIGRAINE) 250-250-65 MG per tablet Take 2 tablets by mouth every 6 (six) hours as needed for headache or migraine. (Patient not taking: Reported on 01/21/2016) 30 tablet 0   . [DISCONTINUED] fluticasone (FLONASE) 50 MCG/ACT nasal spray Place 2 sprays into both nostrils daily. (Patient not taking: Reported on 01/21/2016) 9.9 g 2     ROS: see HPI Physical Exam   Blood pressure 121/76, pulse 92, temperature 98.1 F (36.7 C), temperature source Oral, resp. rate 18, last menstrual period 08/04/2015.  Physical Exam  Constitutional: She appears well-developed and well-nourished.  HENT:  Head: Normocephalic.  Eyes: Conjunctivae are normal. Pupils are equal, round, and reactive to light.  Cardiovascular: Normal rate, regular rhythm, normal heart sounds and intact distal pulses.   Respiratory: Effort normal and breath sounds normal. No respiratory distress.  GI: Bowel sounds are normal. She exhibits distension (gravid abdomen).  Musculoskeletal: She exhibits  no edema.  Neurological: She is alert.  Skin: Skin is warm and dry.  Psychiatric: She has a normal mood and affect. Her behavior is normal. Judgment and thought content normal.  Dilation: Closed Effacement (%): Thick Exam by:: gambino GYN:  External genitalia within normal limits. Vaginal mucosa slightly erythematous, moist, normal rugae. Nonfriable cervix without lesions, some creamy white discharge on speculum exam, no bleeding noted.    MAU Course  Procedures  MDM Differentials include BV, Trichomonas, or yeast  infection. Speculum exam showed no pooling of fluid but did have some creamy white discharge present. Wet Prep showing many WBC and positive yeast. GC/Chlamydia pending. Due to no pre-natal care, pre-natal labs taken.   Assessment and Plan  Sarah Mueller is a 40 yo G3P1012 at [redacted]w[redacted]d who presents with no prenatal care complaining of vaginal itching.  Vaginal itching: Likely vaginal yeast infection as yeast were seen on wet prep.  - Miconazole 2% vaginal cream x 7 days - Informational handout provided on yeast infection  No prenatal care:  - Prenatal labs drawn - Discussed importance of prenatal care with patient. She is taking prenatal vitamins.  - Patient referred to [facility] - anatomy scan ordered  Carlyle Dolly 01/21/2016, 10:37 AM   OB FELLOW MAU DISCHARGE ATTESTATION  I have seen and examined this patient; I agree with above documentation in the resident's note.    Desma Maxim, MD 3:52 PM

## 2016-01-22 LAB — RUBELLA SCREEN: Rubella: 2.47 index (ref >0.99)

## 2016-01-22 LAB — CULTURE, OB URINE

## 2016-01-22 LAB — GC/CHLAMYDIA PROBE AMP (~~LOC~~) NOT AT ARMC
Chlamydia: NEGATIVE
Neisseria Gonorrhea: NEGATIVE

## 2016-01-22 LAB — RPR: RPR Ser Ql: NONREACTIVE

## 2016-01-22 LAB — HIV ANTIBODY (ROUTINE TESTING W REFLEX): HIV SCREEN 4TH GENERATION: NONREACTIVE

## 2016-01-29 ENCOUNTER — Inpatient Hospital Stay (HOSPITAL_COMMUNITY)
Admission: AD | Admit: 2016-01-29 | Discharge: 2016-01-29 | Disposition: A | Discharge status: Home / Self Care | Payer: Medicaid Other | Source: Ambulatory Visit | Attending: Family Medicine | Admitting: Family Medicine

## 2016-01-29 ENCOUNTER — Encounter (HOSPITAL_COMMUNITY): Payer: Self-pay | Admitting: *Deleted

## 2016-01-29 DIAGNOSIS — R109 Unspecified abdominal pain: Secondary | ICD-10-CM | POA: Diagnosis not present

## 2016-01-29 DIAGNOSIS — O9989 Other specified diseases and conditions complicating pregnancy, childbirth and the puerperium: Secondary | ICD-10-CM

## 2016-01-29 DIAGNOSIS — R1031 Right lower quadrant pain: Secondary | ICD-10-CM | POA: Diagnosis present

## 2016-01-29 DIAGNOSIS — Z87891 Personal history of nicotine dependence: Secondary | ICD-10-CM | POA: Insufficient documentation

## 2016-01-29 DIAGNOSIS — O26892 Other specified pregnancy related conditions, second trimester: Secondary | ICD-10-CM | POA: Diagnosis not present

## 2016-01-29 DIAGNOSIS — N949 Unspecified condition associated with female genital organs and menstrual cycle: Secondary | ICD-10-CM | POA: Diagnosis not present

## 2016-01-29 DIAGNOSIS — Z3A25 25 weeks gestation of pregnancy: Secondary | ICD-10-CM

## 2016-01-29 DIAGNOSIS — R102 Pelvic and perineal pain: Secondary | ICD-10-CM | POA: Diagnosis not present

## 2016-01-29 LAB — URINE MICROSCOPIC-ADD ON

## 2016-01-29 LAB — URINALYSIS, ROUTINE W REFLEX MICROSCOPIC
GLUCOSE, UA: NEGATIVE mg/dL
Hgb urine dipstick: NEGATIVE
Ketones, ur: >80 mg/dL — AB
LEUKOCYTES UA: NEGATIVE
Nitrite: NEGATIVE
PH: 6.5 (ref 5.0–8.0)
Protein, ur: 100 mg/dL — AB
Specific Gravity, Urine: 1.02 (ref 1.005–1.030)

## 2016-01-29 NOTE — Discharge Instructions (Signed)
Round Ligament Pain  The round ligament is a cord of muscle and tissue that helps to support the uterus. It can become a source of pain during pregnancy if it becomes stretched or twisted as the baby grows. The pain usually begins in the second trimester of pregnancy, and it can come and go until the baby is delivered. It is not a serious problem, and it does not cause harm to the baby.  Round ligament pain is usually a short, sharp, and pinching pain, but it can also be a dull, lingering, and aching pain. The pain is felt in the lower side of the abdomen or in the groin. It usually starts deep in the groin and moves up to the outside of the hip area. Pain can occur with:   A sudden change in position.   Rolling over in bed.   Coughing or sneezing.   Physical activity.  HOME CARE INSTRUCTIONS  Watch your condition for any changes. Take these steps to help with your pain:   When the pain starts, relax. Then try:    Sitting down.    Flexing your knees up to your abdomen.    Lying on your side with one pillow under your abdomen and another pillow between your legs.    Sitting in a warm bath for 15-20 minutes or until the pain goes away.   Take over-the-counter and prescription medicines only as told by your health care provider.   Move slowly when you sit and stand.   Avoid long walks if they cause pain.   Stop or lessen your physical activities if they cause pain.  SEEK MEDICAL CARE IF:   Your pain does not go away with treatment.   You feel pain in your back that you did not have before.   Your medicine is not helping.  SEEK IMMEDIATE MEDICAL CARE IF:   You develop a fever or chills.   You develop uterine contractions.   You develop vaginal bleeding.   You develop nausea or vomiting.   You develop diarrhea.   You have pain when you urinate.     This information is not intended to replace advice given to you by your health care provider. Make sure you discuss any questions you have with your health  care provider.     Document Released: 07/29/2008 Document Revised: 01/12/2012 Document Reviewed: 12/27/2014  Elsevier Interactive Patient Education 2016 [employer]

## 2016-01-29 NOTE — MAU Note (Signed)
Pain in RLQ, radiates into groin and low back.  Started last night, would come and go, when got up this morning, still present and would not go away

## 2016-01-29 NOTE — MAU Note (Signed)
C/o sharp aching pain in R lower abdomen that radilates to R Groin and r lower back- pain started yesterday afternoon around 1800;denies injury; tried warm shower to relieve pain;

## 2016-01-29 NOTE — MAU Provider Note (Signed)
History   CSN: [id number]  Arrival date and time: 01/29/16 1023  First Provider Initiated Contact with Patient 01/29/16 1124     Chief Complaint  Patient presents with  . Abdominal Pain   HPI  Sarah Mueller is a 40 yo G3P1011 at [redacted]w[redacted]d presenting to the MAU today complaining of RLQ pain. The patient states that pain started yesterday evening, lasting approximately 30 minutes before abating for one hour. This pattern persisted until early this morning, and now the patient states the pain is constant. The pain is achy and crampy, and radiates to the patient's R lower back. The patient denies feeling this pain with past pregnancies. She has not attempted to treat her pain with medications or heat/ice. The patient endorses positive fetal movement. She denies vaginal bleeding or leaking of fluid, contractions, odor or discharge, urinary urgency or frequency, burning with urination, diarrhea, constipation, nausea, vomiting, swelling, SOB, chest pain, palpitations, changes in diet, new foods, or recent trauma to the area. The patient is currently in the process of establishing prenatal care here at [facility].  OB History    Gravida Para Term Preterm AB TAB SAB Ectopic Multiple Living   3 1 1  1 1  0  0 1      Past Medical History  Diagnosis Date  . Pancreatitis 03/30/2013  . Infection     UTI  . Depression     fine now  . Chlamydia     Past Surgical History  Procedure Laterality Date  . Cesarean section  2005  . Dilation and curettage of uterus  ~ 2000  . Cholecystectomy N/A 03/31/2013    Procedure: LAPAROSCOPIC CHOLECYSTECTOMY WITH INTRAOPERATIVE CHOLANGIOGRAM;  Surgeon: Ralene Ok, MD;  Location: [place];  Service: General;  Laterality: N/A;  . Induced abortion      Family History  Problem Relation Age of Onset  . Hypertension Mother   . Stroke Maternal Grandmother   . Stroke Maternal Grandfather   . Stroke Paternal Grandmother   . Stroke Paternal Grandfather   . Hypertension  Paternal Grandfather     Social History  Substance Use Topics  . Smoking status: Former Smoker -- 0.33 packs/day for 15 years    Types: Cigarettes  . Smokeless tobacco: Never Used     Comment: quit early Jan  . Alcohol Use: No    Allergies: No Known Allergies  Prescriptions prior to admission  Medication Sig Dispense Refill Last Dose  . Ca Carbonate-Mag Hydroxide (ROLAIDS ANTACID ULTRA STRENGTH PO) Take 1 tablet by mouth daily as needed (for heartburn).   01/18/2016  . miconazole ([facility] MICONAZOLE 7) 2 % vaginal cream Place 1 Applicatorful vaginally at bedtime. 45 g 0   . Prenatal Vit-Fe Fumarate-FA (PRENATAL MULTIVITAMIN) TABS tablet Take 1 tablet by mouth daily at 12 noon.   01/21/2016 at Unknown time    Review of Systems  Constitutional: Negative for fever, chills, malaise/fatigue and diaphoresis.  HENT: Negative for congestion, nosebleeds and sore throat.   Eyes: Negative for blurred vision and double vision.  Respiratory: Negative for cough, shortness of breath and wheezing.   Cardiovascular: Negative for chest pain, palpitations and leg swelling.  Gastrointestinal: Positive for abdominal pain. Negative for heartburn, nausea, vomiting, diarrhea, constipation and blood in stool.  Genitourinary: Negative for dysuria, urgency, frequency, hematuria and flank pain.  Musculoskeletal: Negative for myalgias and joint pain.  Skin: Negative for itching and rash.  Neurological: Negative for dizziness, focal weakness, seizures, loss of consciousness, weakness and headaches.  Endo/Heme/Allergies: Does not bruise/bleed easily.  Also per HPI  Physical Exam   Blood pressure 120/83, pulse 94, temperature 98.7 F (37.1 C), temperature source Oral, resp. rate 20, last menstrual period 08/04/2015.  Physical Exam  Nursing note and vitals reviewed. Constitutional: She is oriented to person, place, and time. She appears well-developed and well-nourished. No distress.  HENT:  Head: Normocephalic  and atraumatic.  Neck: Normal range of motion. Neck supple.  Cardiovascular: Normal rate, regular rhythm, normal heart sounds and intact distal pulses.  Exam reveals no gallop and no friction rub.   No murmur heard. Respiratory: Effort normal and breath sounds normal. No respiratory distress. She has no wheezes. She has no rales. She exhibits no tenderness.  GI: Soft. Normal appearance and bowel sounds are normal. She exhibits no shifting dullness. There is tenderness in the right lower quadrant. There is no rigidity, no rebound, no guarding, no CVA tenderness, no tenderness at McBurney's point and negative Murphy's sign.  Musculoskeletal: Normal range of motion. She exhibits no edema or tenderness.  Neurological: She is alert and oriented to person, place, and time.  Skin: Skin is warm and dry. No rash noted. She is not diaphoretic. No erythema. No pallor.  Psychiatric: She has a normal mood and affect. Her behavior is normal.   MAU Course  Procedures - None  MDM [place] without contractions FHR documented and appropriate  Results for orders placed or performed during the hospital encounter of 01/29/16 (from the past 24 hour(s))  Urinalysis, Routine w reflex microscopic (not at [facility])     Status: Abnormal   Collection Time: 01/29/16 10:40 AM  Result Value Ref Range   Color, Urine ORANGE (A) YELLOW   APPearance HAZY (A) CLEAR   Specific Gravity, Urine 1.020 1.005 - 1.030   pH 6.5 5.0 - 8.0   Glucose, UA NEGATIVE NEGATIVE mg/dL   Hgb urine dipstick NEGATIVE NEGATIVE   Bilirubin Urine MODERATE (A) NEGATIVE   Ketones, ur >80 (A) NEGATIVE mg/dL   Protein, ur 100 (A) NEGATIVE mg/dL   Nitrite NEGATIVE NEGATIVE   Leukocytes, UA NEGATIVE NEGATIVE  Urine microscopic-add on     Status: Abnormal   Collection Time: 01/29/16 10:40 AM  Result Value Ref Range   Squamous Epithelial / LPF 6-30 (A) NONE SEEN   WBC, UA 0-5 0 - 5 WBC/hpf   RBC / HPF 0-5 0 - 5 RBC/hpf   Bacteria, UA RARE (A) NONE SEEN    Urine-Other MUCOUS PRESENT      Assessment and Plan  Patient is a 40 yo G3P1011 at [redacted]w[redacted]d with uncomplicated pregnancy thus far. She is in the process of establishing prenatal care. She is afebrile and not presenting with hypertension, changes in vision, RUQ pain, or vaginal bleeding/discharge. The patient's symptoms today are most likely due to round ligament pain.  Round Ligament Pain - Recommended Tylenol and light heat to area for symptoms relief -Also counseled on use of belly band for support - Patient was educated on round ligament pain using visual aids and teachback; patient displayed understanding. Handout given to patient. - Establish care at [facility] here at [facility] - Return precautions discussed   Tobey Grim, PA-S 01/29/2016, 11:41 AM  Luiz Blare, DO 01/29/2016, 11:25 AM PGY-2, [facility]  OB FELLOW MAU DISCHARGE ATTESTATION  I have seen and examined this patient; I agree with above documentation in the resident's note.    Desma Maxim, MD 1:22 PM

## 2016-02-18 ENCOUNTER — Ambulatory Visit (INDEPENDENT_AMBULATORY_CARE_PROVIDER_SITE_OTHER): Payer: Medicaid Other | Admitting: Obstetrics & Gynecology

## 2016-02-18 ENCOUNTER — Encounter: Payer: Self-pay | Admitting: *Deleted

## 2016-02-18 ENCOUNTER — Other Ambulatory Visit (HOSPITAL_COMMUNITY)
Admission: RE | Admit: 2016-02-18 | Discharge: 2016-02-18 | Disposition: A | Discharge status: Home / Self Care | Payer: Medicaid Other | Source: Ambulatory Visit | Attending: Obstetrics & Gynecology | Admitting: Obstetrics & Gynecology

## 2016-02-18 ENCOUNTER — Encounter: Payer: Self-pay | Admitting: Obstetrics & Gynecology

## 2016-02-18 DIAGNOSIS — O09522 Supervision of elderly multigravida, second trimester: Secondary | ICD-10-CM | POA: Diagnosis not present

## 2016-02-18 DIAGNOSIS — Z1151 Encounter for screening for human papillomavirus (HPV): Secondary | ICD-10-CM | POA: Insufficient documentation

## 2016-02-18 DIAGNOSIS — Z01419 Encounter for gynecological examination (general) (routine) without abnormal findings: Secondary | ICD-10-CM | POA: Insufficient documentation

## 2016-02-18 DIAGNOSIS — O34219 Maternal care for unspecified type scar from previous cesarean delivery: Secondary | ICD-10-CM | POA: Diagnosis not present

## 2016-02-18 DIAGNOSIS — O09529 Supervision of elderly multigravida, unspecified trimester: Secondary | ICD-10-CM | POA: Insufficient documentation

## 2016-02-18 DIAGNOSIS — Z3482 Encounter for supervision of other normal pregnancy, second trimester: Secondary | ICD-10-CM | POA: Diagnosis present

## 2016-02-18 DIAGNOSIS — Z23 Encounter for immunization: Secondary | ICD-10-CM

## 2016-02-18 DIAGNOSIS — Z3490 Encounter for supervision of normal pregnancy, unspecified, unspecified trimester: Secondary | ICD-10-CM | POA: Insufficient documentation

## 2016-02-18 LAB — POCT URINALYSIS DIP (DEVICE)
BILIRUBIN URINE: NEGATIVE
Glucose, UA: NEGATIVE mg/dL
HGB URINE DIPSTICK: NEGATIVE
KETONES UR: 15 mg/dL — AB
Leukocytes, UA: NEGATIVE
Nitrite: NEGATIVE
PH: 7 (ref 5.0–8.0)
Protein, ur: NEGATIVE mg/dL
Specific Gravity, Urine: 1.02 (ref 1.005–1.030)
Urobilinogen, UA: 1 mg/dL (ref 0.0–1.0)

## 2016-02-18 MED ORDER — TETANUS-DIPHTH-ACELL PERTUSSIS 5-2.5-18.5 LF-MCG/0.5 IM SUSP
0.5000 mL | Freq: Once | INTRAMUSCULAR | Status: AC
  Administered 2016-02-18: 0.5 mL via INTRAMUSCULAR

## 2016-02-18 NOTE — Patient Instructions (Signed)

## 2016-02-18 NOTE — Progress Notes (Signed)
Urine 15 ketones Initial and 28 week prenatal packets given Declines flu Tdap today

## 2016-02-18 NOTE — Progress Notes (Signed)
U/S scheduled for 04/20 @ 1115am

## 2016-02-18 NOTE — Progress Notes (Signed)
   Subjective:late to care    Sarah Mueller is a [id number] [redacted]w[redacted]d being seen today for her first obstetrical visit.  Her obstetrical history is significant for advanced maternal age and previous cesarean. Patient does intend to breast feed. Pregnancy history fully reviewed.  Patient reports no complaints.  Filed Vitals:   02/18/16 0803  BP: 125/77  Pulse: 89  Temp: 98.7 F (37.1 C)  Weight: 257 lb 1.6 oz (116.62 kg)    HISTORY: OB History  Gravida Para Term Preterm AB SAB TAB Ectopic Multiple Living  3 1 1  1  0 1  0 1    # Outcome Date GA Lbr Len/2nd Weight Sex Delivery Anes PTL Lv  3 Current           2 Term 09/28/04 [redacted]w[redacted]d   F CS-Unspec EPI N Y     Complications: Fetal Intolerance  1 TAB              Past Medical History  Diagnosis Date  . Pancreatitis 03/30/2013  . Infection     UTI  . Depression     fine now  . Chlamydia    Past Surgical History  Procedure Laterality Date  . Cesarean section  2005  . Dilation and curettage of uterus  ~ 2000  . Cholecystectomy N/A 03/31/2013    Procedure: LAPAROSCOPIC CHOLECYSTECTOMY WITH INTRAOPERATIVE CHOLANGIOGRAM;  Surgeon: Ralene Ok, MD;  Location: [place];  Service: General;  Laterality: N/A;  . Induced abortion     Family History  Problem Relation Age of Onset  . Hypertension Mother   . Stroke Maternal Grandmother   . Stroke Maternal Grandfather   . Stroke Paternal Grandmother   . Stroke Paternal Grandfather   . Hypertension Paternal Grandfather      Exam    Uterus:     Pelvic Exam:    Perineum: No Hemorrhoids   Vulva: Bartholin's, Urethra, Skene's normal   Vagina:  normal mucosa   pH:    Cervix: no lesions   Adnexa: not evaluated   Bony Pelvis: average  System: Breast:  normal appearance, no masses or tenderness   Skin: normal coloration and turgor, no rashes    Neurologic: oriented, normal mood   Extremities: normal strength, tone, and muscle mass   HEENT thyroid without masses   Mouth/Teeth  dental hygiene good   Neck supple   Cardiovascular: regular rate and rhythm, no murmurs or gallops   Respiratory:  appears well, vitals normal, no respiratory distress, acyanotic, normal RR, chest clear, no wheezing, crepitations, rhonchi, normal symmetric air entry   Abdomen: soft, non-tender; bowel sounds normal; no masses,  no organomegaly   Urinary: urethral meatus normal      Assessment:    Pregnancy: [id number] Patient Active Problem List   Diagnosis Date Noted  . Supervision of normal pregnancy, antepartum 02/18/2016  . Previous cesarean delivery, antepartum condition or complication [id number]  . Antepartum multigravida of advanced maternal age 56/17/2017  . Pancreatitis, acute 03/30/2013  . Obesity, unspecified 03/30/2013  . Cholelithiasis 03/30/2013  . Tobacco abuse 03/30/2013        Plan:     Initial labs drawn. Prenatal vitamins. Problem list reviewed and updated. Genetic Screening too late.  Ultrasound discussed; fetal survey: requested.  Follow up in 2 weeks. 50% of 30 min visit spent on counseling and coordination of care.  Discussed TOLAC vs cesarean repeat   ARNOLD,JAMES 02/18/2016

## 2016-02-19 LAB — CYTOLOGY - PAP

## 2016-02-19 LAB — CULTURE, OB URINE: Colony Count: 15000

## 2016-02-19 LAB — GLUCOSE TOLERANCE, 1 HOUR (50G) W/O FASTING: Glucose, 1 Hr, gestational: 116 mg/dL (ref <140)

## 2016-02-21 ENCOUNTER — Other Ambulatory Visit (HOSPITAL_COMMUNITY): Payer: Self-pay | Admitting: Obstetrics and Gynecology

## 2016-02-21 ENCOUNTER — Ambulatory Visit (HOSPITAL_COMMUNITY)
Admission: RE | Admit: 2016-02-21 | Discharge: 2016-02-21 | Disposition: A | Discharge status: Home / Self Care | Payer: Medicaid Other | Source: Ambulatory Visit | Attending: Obstetrics and Gynecology | Admitting: Obstetrics and Gynecology

## 2016-02-21 DIAGNOSIS — Z3490 Encounter for supervision of normal pregnancy, unspecified, unspecified trimester: Secondary | ICD-10-CM

## 2016-02-21 DIAGNOSIS — O0933 Supervision of pregnancy with insufficient antenatal care, third trimester: Secondary | ICD-10-CM | POA: Diagnosis not present

## 2016-02-21 DIAGNOSIS — O34219 Maternal care for unspecified type scar from previous cesarean delivery: Secondary | ICD-10-CM | POA: Insufficient documentation

## 2016-02-21 DIAGNOSIS — Z36 Encounter for antenatal screening of mother: Secondary | ICD-10-CM | POA: Diagnosis not present

## 2016-02-21 DIAGNOSIS — O99213 Obesity complicating pregnancy, third trimester: Secondary | ICD-10-CM

## 2016-02-21 DIAGNOSIS — E669 Obesity, unspecified: Secondary | ICD-10-CM | POA: Insufficient documentation

## 2016-02-21 DIAGNOSIS — Z3A28 28 weeks gestation of pregnancy: Secondary | ICD-10-CM | POA: Insufficient documentation

## 2016-02-21 DIAGNOSIS — D259 Leiomyoma of uterus, unspecified: Secondary | ICD-10-CM | POA: Diagnosis not present

## 2016-02-21 DIAGNOSIS — Z3480 Encounter for supervision of other normal pregnancy, unspecified trimester: Secondary | ICD-10-CM

## 2016-02-21 DIAGNOSIS — O09523 Supervision of elderly multigravida, third trimester: Secondary | ICD-10-CM

## 2016-02-21 DIAGNOSIS — O3413 Maternal care for benign tumor of corpus uteri, third trimester: Secondary | ICD-10-CM | POA: Insufficient documentation

## 2016-02-21 LAB — CANNABANOIDS (GC/LC/MS), URINE: THC-COOH UR CONFIRM: 323 ng/mL — AB (ref <5)

## 2016-02-22 LAB — PRESCRIPTION MONITORING PROFILE (19 PANEL)
Amphetamine/Meth: NEGATIVE ng/mL
BENZODIAZEPINE SCREEN, URINE: NEGATIVE ng/mL
Barbiturate Screen, Urine: NEGATIVE ng/mL
Buprenorphine, Urine: NEGATIVE ng/mL
CARISOPRODOL, URINE: NEGATIVE ng/mL
COCAINE METABOLITES: NEGATIVE ng/mL
CREATININE, URINE: 188.77 mg/dL (ref >20.0)
ECSTASY: NEGATIVE ng/mL
FENTANYL URINE: NEGATIVE ng/mL
MEPERIDINE UR: NEGATIVE ng/mL
Methadone Screen, Urine: NEGATIVE ng/mL
Methaqualone: NEGATIVE ng/mL
NITRITES URINE, INITIAL: NEGATIVE ug/mL
OXYCODONE SCRN UR: NEGATIVE ng/mL
Opiate Screen, Urine: NEGATIVE ng/mL
PH URINE, INITIAL: 7.4 pH (ref 4.5–8.9)
PROPOXYPHENE: NEGATIVE ng/mL
Phencyclidine, Ur: NEGATIVE ng/mL
TRAMADOL UR: NEGATIVE ng/mL
Tapentadol, urine: NEGATIVE ng/mL
Zolpidem, Urine: NEGATIVE ng/mL

## 2016-03-18 ENCOUNTER — Ambulatory Visit (INDEPENDENT_AMBULATORY_CARE_PROVIDER_SITE_OTHER): Payer: Medicaid Other | Admitting: Student

## 2016-03-18 VITALS — BP 118/69 | HR 88 | Wt 256.7 lb

## 2016-03-18 DIAGNOSIS — Z3483 Encounter for supervision of other normal pregnancy, third trimester: Secondary | ICD-10-CM

## 2016-03-18 LAB — POCT URINALYSIS DIP (DEVICE)
Glucose, UA: NEGATIVE mg/dL
Hgb urine dipstick: NEGATIVE
Leukocytes, UA: NEGATIVE
NITRITE: NEGATIVE
PH: 6 (ref 5.0–8.0)
PROTEIN: 30 mg/dL — AB
Specific Gravity, Urine: 1.025 (ref 1.005–1.030)
Urobilinogen, UA: 1 mg/dL (ref 0.0–1.0)

## 2016-03-18 NOTE — Patient Instructions (Signed)

## 2016-03-18 NOTE — Progress Notes (Signed)
Hemorrhoid x 1 week Irritated, not painful Not constipated No rectal bleeding Feels like somewhat larger Subjective:  Sarah Mueller is a 40 y.o. G3P1011 at [redacted]w[redacted]d being seen today for ongoing prenatal care.  She is currently monitored for the following issues for this high-risk pregnancy and has Pancreatitis, acute; Obesity, unspecified; Cholelithiasis; Tobacco abuse; Supervision of normal pregnancy, antepartum; Previous cesarean delivery, antepartum condition or complication; and Antepartum multigravida of advanced maternal age on her problem list.  Patient reports hemorrhoids. Noticed hemorrhoid x 1 week. Some irritation. Denies pain or rectal bleeding. Contractions: Not present. Vag. Bleeding: None.  Movement: Present. Denies leaking of fluid.   The following portions of the patient's history were reviewed and updated as appropriate: allergies, current medications, past family history, past medical history, past social history, past surgical history and problem list. Problem list updated.  Objective:   Filed Vitals:   03/18/16 0821  BP: 118/69  Pulse: 88  Weight: 256 lb 11.2 oz (116.438 kg)    Fetal Status: Fetal Heart Rate (bpm): 150 Fundal Height: 33 cm Movement: Present     General:  Alert, oriented and cooperative. Patient is in no acute distress.  Skin: Skin is warm and dry. No rash noted.   Cardiovascular: Normal heart rate noted  Respiratory: Normal respiratory effort, no problems with respiration noted  Abdomen: Soft, gravid, appropriate for gestational age. Pain/Pressure: Present     Rectal:    Small flesh colored external hemorrhoid  Pelvic: Vag. Bleeding: None     Cervical exam deferred        Extremities: Normal range of motion.  Edema: None  Mental Status: Normal mood and affect. Normal behavior. Normal judgment and thought content.   Urinalysis:      Assessment and Plan:  Pregnancy: G3P1011 at [redacted]w[redacted]d  1. Supervision of normal pregnancy, antepartum, third  trimester  - [place] MFM OB FOLLOW UP; Future  2. External hemorrhoids during pregnancy -Discussed at home treatment  Preterm labor symptoms and general obstetric precautions including but not limited to vaginal bleeding, contractions, leaking of fluid and fetal movement were reviewed in detail with the patient. Please refer to After Visit Summary for other counseling recommendations.  Return in about 2 weeks (around 04/01/2016) for Routine OB.   Jorje Guild, NP

## 2016-03-18 NOTE — Progress Notes (Signed)
Pt complaining of hemorrhoids.

## 2016-03-24 ENCOUNTER — Ambulatory Visit (HOSPITAL_COMMUNITY)
Admission: RE | Admit: 2016-03-24 | Discharge: 2016-03-24 | Disposition: A | Discharge status: Home / Self Care | Payer: Medicaid Other | Source: Ambulatory Visit | Attending: Student | Admitting: Student

## 2016-03-24 DIAGNOSIS — Z36 Encounter for antenatal screening of mother: Secondary | ICD-10-CM | POA: Diagnosis not present

## 2016-03-24 DIAGNOSIS — O34219 Maternal care for unspecified type scar from previous cesarean delivery: Secondary | ICD-10-CM | POA: Insufficient documentation

## 2016-03-24 DIAGNOSIS — O09523 Supervision of elderly multigravida, third trimester: Secondary | ICD-10-CM | POA: Diagnosis not present

## 2016-03-24 DIAGNOSIS — Z3483 Encounter for supervision of other normal pregnancy, third trimester: Secondary | ICD-10-CM

## 2016-03-24 DIAGNOSIS — Z3A33 33 weeks gestation of pregnancy: Secondary | ICD-10-CM | POA: Diagnosis not present

## 2016-03-24 DIAGNOSIS — O0933 Supervision of pregnancy with insufficient antenatal care, third trimester: Secondary | ICD-10-CM | POA: Diagnosis not present

## 2016-03-24 DIAGNOSIS — O99213 Obesity complicating pregnancy, third trimester: Secondary | ICD-10-CM | POA: Insufficient documentation

## 2016-03-24 DIAGNOSIS — O3413 Maternal care for benign tumor of corpus uteri, third trimester: Secondary | ICD-10-CM | POA: Diagnosis not present

## 2016-03-24 DIAGNOSIS — D259 Leiomyoma of uterus, unspecified: Secondary | ICD-10-CM | POA: Insufficient documentation

## 2016-04-01 ENCOUNTER — Ambulatory Visit (INDEPENDENT_AMBULATORY_CARE_PROVIDER_SITE_OTHER): Payer: Medicaid Other | Admitting: Family Medicine

## 2016-04-01 VITALS — BP 90/72 | HR 88 | Wt 260.1 lb

## 2016-04-01 DIAGNOSIS — O99213 Obesity complicating pregnancy, third trimester: Secondary | ICD-10-CM

## 2016-04-01 DIAGNOSIS — Z3483 Encounter for supervision of other normal pregnancy, third trimester: Secondary | ICD-10-CM

## 2016-04-01 DIAGNOSIS — E669 Obesity, unspecified: Secondary | ICD-10-CM

## 2016-04-01 DIAGNOSIS — O09523 Supervision of elderly multigravida, third trimester: Secondary | ICD-10-CM

## 2016-04-01 DIAGNOSIS — Z720 Tobacco use: Secondary | ICD-10-CM

## 2016-04-01 DIAGNOSIS — O99210 Obesity complicating pregnancy, unspecified trimester: Secondary | ICD-10-CM | POA: Insufficient documentation

## 2016-04-01 DIAGNOSIS — O34219 Maternal care for unspecified type scar from previous cesarean delivery: Secondary | ICD-10-CM

## 2016-04-01 LAB — POCT URINALYSIS DIP (DEVICE)
Bilirubin Urine: NEGATIVE
GLUCOSE, UA: NEGATIVE mg/dL
Hgb urine dipstick: NEGATIVE
Ketones, ur: NEGATIVE mg/dL
Leukocytes, UA: NEGATIVE
Nitrite: NEGATIVE
PROTEIN: NEGATIVE mg/dL
SPECIFIC GRAVITY, URINE: 1.015 (ref 1.005–1.030)
UROBILINOGEN UA: 0.2 mg/dL (ref 0.0–1.0)
pH: 7 (ref 5.0–8.0)

## 2016-04-01 NOTE — Patient Instructions (Addendum)
[employer]-- breastfeeding supplement   Breastfeeding Deciding to breastfeed is one of the best choices you can make for you and your baby. A change in hormones during pregnancy causes your breast tissue to grow and increases the number and size of your milk ducts. These hormones also allow proteins, sugars, and fats from your blood supply to make breast milk in your milk-producing glands. Hormones prevent breast milk from being released before your baby is born as well as prompt milk flow after birth. Once breastfeeding has begun, thoughts of your baby, as well as his or her sucking or crying, can stimulate the release of milk from your milk-producing glands.  BENEFITS OF BREASTFEEDING For Your Baby  Your first milk (colostrum) helps your baby's digestive system function better.  There are antibodies in your milk that help your baby fight off infections.  Your baby has a lower incidence of asthma, allergies, and sudden infant death syndrome.  The nutrients in breast milk are better for your baby than infant formulas and are designed uniquely for your baby's needs.  Breast milk improves your baby's brain development.  Your baby is less likely to develop other conditions, such as childhood obesity, asthma, or type 2 diabetes mellitus. For You  Breastfeeding helps to create a very special bond between you and your baby.  Breastfeeding is convenient. Breast milk is always available at the correct temperature and costs nothing.  Breastfeeding helps to burn calories and helps you lose the weight gained during pregnancy.  Breastfeeding makes your uterus contract to its prepregnancy size faster and slows bleeding (lochia) after you give birth.   Breastfeeding helps to lower your risk of developing type 2 diabetes mellitus, osteoporosis, and breast or ovarian cancer later in life. SIGNS THAT YOUR BABY IS HUNGRY Early Signs of Hunger  Increased alertness or  activity.  Stretching.  Movement of the head from side to side.  Movement of the head and opening of the mouth when the corner of the mouth or cheek is stroked (rooting).  Increased sucking sounds, smacking lips, cooing, sighing, or squeaking.  Hand-to-mouth movements.  Increased sucking of fingers or hands. Late Signs of Hunger  Fussing.  Intermittent crying. Extreme Signs of Hunger Signs of extreme hunger will require calming and consoling before your baby will be able to breastfeed successfully. Do not wait for the following signs of extreme hunger to occur before you initiate breastfeeding:  Restlessness.  A loud, strong cry.  Screaming. BREASTFEEDING BASICS Breastfeeding Initiation  Find a comfortable place to sit or lie down, with your neck and back well supported.  Place a pillow or rolled up blanket under your baby to bring him or her to the level of your breast (if you are seated). Nursing pillows are specially designed to help support your arms and your baby while you breastfeed.  Make sure that your baby's abdomen is facing your abdomen.  Gently massage your breast. With your fingertips, massage from your chest wall toward your nipple in a circular motion. This encourages milk flow. You may need to continue this action during the feeding if your milk flows slowly.  Support your breast with 4 fingers underneath and your thumb above your nipple. Make sure your fingers are well away from your nipple and your baby's mouth.  Stroke your baby's lips gently with your finger or nipple.  When your baby's mouth is open wide enough, quickly bring your baby to your breast, placing your entire nipple and as much of the  colored area around your nipple (areola) as possible into your baby's mouth.  More areola should be visible above your baby's upper lip than below the lower lip.  Your baby's tongue should be between his or her lower gum and your breast.  Ensure that your  baby's mouth is correctly positioned around your nipple (latched). Your baby's lips should create a seal on your breast and be turned out (everted).  It is common for your baby to suck about 2-3 minutes in order to start the flow of breast milk. Latching Teaching your baby how to latch on to your breast properly is very important. An improper latch can cause nipple pain and decreased milk supply for you and poor weight gain in your baby. Also, if your baby is not latched onto your nipple properly, he or she may swallow some air during feeding. This can make your baby fussy. Burping your baby when you switch breasts during the feeding can help to get rid of the air. However, teaching your baby to latch on properly is still the best way to prevent fussiness from swallowing air while breastfeeding. Signs that your baby has successfully latched on to your nipple:  Silent tugging or silent sucking, without causing you pain.  Swallowing heard between every 3-4 sucks.  Muscle movement above and in front of his or her ears while sucking. Signs that your baby has not successfully latched on to nipple:  Sucking sounds or smacking sounds from your baby while breastfeeding.  Nipple pain. If you think your baby has not latched on correctly, slip your finger into the corner of your baby's mouth to break the suction and place it between your baby's gums. Attempt breastfeeding initiation again. Signs of Successful Breastfeeding Signs from your baby:  A gradual decrease in the number of sucks or complete cessation of sucking.  Falling asleep.  Relaxation of his or her body.  Retention of a small amount of milk in his or her mouth.  Letting go of your breast by himself or herself. Signs from you:  Breasts that have increased in firmness, weight, and size 1-3 hours after feeding.  Breasts that are softer immediately after breastfeeding.  Increased milk volume, as well as a change in milk consistency  and color by the fifth day of breastfeeding.  Nipples that are not sore, cracked, or bleeding. Signs That Your [employer] is Getting Enough Milk  Wetting at least 3 diapers in a 24-hour period. The urine should be clear and pale yellow by age 371 days.  At least 3 stools in a 24-hour period by age 371 days. The stool should be soft and yellow.  At least 3 stools in a 24-hour period by age 38 days. The stool should be seedy and yellow.  No loss of weight greater than 10% of birth weight during the first 36 days of age.  Average weight gain of 4-7 ounces (113-198 g) per week after age 17 days.  Consistent daily weight gain by age 39 days, without weight loss after the age of 2 weeks. After a feeding, your baby may spit up a small amount. This is common. BREASTFEEDING FREQUENCY AND DURATION Frequent feeding will help you make more milk and can prevent sore nipples and breast engorgement. Breastfeed when you feel the need to reduce the fullness of your breasts or when your baby shows signs of hunger. This is called "breastfeeding on demand." Avoid introducing a pacifier to your baby while you are working to establish  breastfeeding (the first 4-6 weeks after your baby is born). After this time you may choose to use a pacifier. Research has shown that pacifier use during the first year of a baby's life decreases the risk of sudden infant death syndrome (SIDS). Allow your baby to feed on each breast as long as he or she wants. Breastfeed until your baby is finished feeding. When your baby unlatches or falls asleep while feeding from the first breast, offer the second breast. Because newborns are often sleepy in the first few weeks of life, you may need to awaken your baby to get him or her to feed. Breastfeeding times will vary from baby to baby. However, the following rules can serve as a guide to help you ensure that your baby is properly fed:  Newborns (babies 10 weeks of age or younger) may breastfeed every 1-3  hours.  Newborns should not go longer than 3 hours during the day or 5 hours during the night without breastfeeding.  You should breastfeed your baby a minimum of 8 times in a 24-hour period until you begin to introduce solid foods to your baby at around 67 months of age. BREAST MILK PUMPING Pumping and storing breast milk allows you to ensure that your baby is exclusively fed your breast milk, even at times when you are unable to breastfeed. This is especially important if you are going back to work while you are still breastfeeding or when you are not able to be present during feedings. Your lactation consultant can give you guidelines on how long it is safe to store breast milk. A breast pump is a machine that allows you to pump milk from your breast into a sterile bottle. The pumped breast milk can then be stored in a refrigerator or freezer. Some breast pumps are operated by hand, while others use electricity. Ask your lactation consultant which type will work best for you. Breast pumps can be purchased, but some hospitals and breastfeeding support groups lease breast pumps on a monthly basis. A lactation consultant can teach you how to hand express breast milk, if you prefer not to use a pump. CARING FOR YOUR BREASTS WHILE YOU BREASTFEED Nipples can become dry, cracked, and sore while breastfeeding. The following recommendations can help keep your breasts moisturized and healthy:  Avoid using soap on your nipples.  Wear a supportive bra. Although not required, special nursing bras and tank tops are designed to allow access to your breasts for breastfeeding without taking off your entire bra or top. Avoid wearing underwire-style bras or extremely tight bras.  Air dry your nipples for 3-53minutes after each feeding.  Use only cotton bra pads to absorb leaked breast milk. Leaking of breast milk between feedings is normal.  Use lanolin on your nipples after breastfeeding. Lanolin helps to  maintain your skin's normal moisture barrier. If you use pure lanolin, you do not need to wash it off before feeding your baby again. Pure lanolin is not toxic to your baby. You may also hand express a few drops of breast milk and gently massage that milk into your nipples and allow the milk to air dry. In the first few weeks after giving birth, some women experience extremely full breasts (engorgement). Engorgement can make your breasts feel heavy, warm, and tender to the touch. Engorgement peaks within 3-5 days after you give birth. The following recommendations can help ease engorgement:  Completely empty your breasts while breastfeeding or pumping. You may want to start by  applying warm, moist heat (in the shower or with warm water-soaked hand towels) just before feeding or pumping. This increases circulation and helps the milk flow. If your baby does not completely empty your breasts while breastfeeding, pump any extra milk after he or she is finished.  Wear a snug bra (nursing or regular) or tank top for 1-2 days to signal your body to slightly decrease milk production.  Apply ice packs to your breasts, unless this is too uncomfortable for you.  Make sure that your baby is latched on and positioned properly while breastfeeding. If engorgement persists after 48 hours of following these recommendations, contact your health care provider or a Science writer. OVERALL HEALTH CARE RECOMMENDATIONS WHILE BREASTFEEDING  Eat healthy foods. Alternate between meals and snacks, eating 3 of each per day. Because what you eat affects your breast milk, some of the foods may make your baby more irritable than usual. Avoid eating these foods if you are sure that they are negatively affecting your baby.  Drink milk, fruit juice, and water to satisfy your thirst (about 10 glasses a day).  Rest often, relax, and continue to take your prenatal vitamins to prevent fatigue, stress, and anemia.  Continue  breast self-awareness checks.  Avoid chewing and smoking tobacco. Chemicals from cigarettes that pass into breast milk and exposure to secondhand smoke may harm your baby.  Avoid alcohol and drug use, including marijuana. Some medicines that may be harmful to your baby can pass through breast milk. It is important to ask your health care provider before taking any medicine, including all over-the-counter and prescription medicine as well as vitamin and herbal supplements. It is possible to become pregnant while breastfeeding. If birth control is desired, ask your health care provider about options that will be safe for your baby. SEEK MEDICAL CARE IF:  You feel like you want to stop breastfeeding or have become frustrated with breastfeeding.  You have painful breasts or nipples.  Your nipples are cracked or bleeding.  Your breasts are red, tender, or warm.  You have a swollen area on either breast.  You have a fever or chills.  You have nausea or vomiting.  You have drainage other than breast milk from your nipples.  Your breasts do not become full before feedings by the fifth day after you give birth.  You feel sad and depressed.  Your baby is too sleepy to eat well.  Your baby is having trouble sleeping.   Your baby is wetting less than 3 diapers in a 24-hour period.  Your baby has less than 3 stools in a 24-hour period.  Your baby's skin or the white part of his or her eyes becomes yellow.   Your baby is not gaining weight by 94 days of age. SEEK IMMEDIATE MEDICAL CARE IF:  Your baby is overly tired (lethargic) and does not want to wake up and feed.  Your baby develops an unexplained fever.   This information is not intended to replace advice given to you by your health care provider. Make sure you discuss any questions you have with your health care provider.   Document Released: 10/20/2005 Document Revised: 07/11/2015 Document Reviewed: 04/13/2013 Elsevier  Interactive Patient Education [employer].

## 2016-04-01 NOTE — Progress Notes (Signed)
Subjective:  Sarah Mueller is a 40 y.o. G3P1011 at [redacted]w[redacted]d being seen today for ongoing prenatal care.  She is currently monitored for the following issues for this low-risk pregnancy and has Pancreatitis, acute; Obesity, unspecified; Cholelithiasis; Tobacco abuse; Supervision of normal pregnancy, antepartum; Previous cesarean delivery, antepartum condition or complication; Antepartum multigravida of advanced maternal age; and Maternal obesity, antepartum on her problem list.  Patient reports no complaints.  Contractions: Not present. Vag. Bleeding: None.  Movement: Present. Denies leaking of fluid.   The following portions of the patient's history were reviewed and updated as appropriate: allergies, current medications, past family history, past medical history, past social history, past surgical history and problem list. Problem list updated.  Objective:   Filed Vitals:   04/01/16 0807  BP: 90/72  Pulse: 88  Weight: 260 lb 1.6 oz (117.981 kg)    Fetal Status: Fetal Heart Rate (bpm): 140   Movement: Present     General:  Alert, oriented and cooperative. Patient is in no acute distress.  Skin: Skin is warm and dry. No rash noted.   Cardiovascular: Normal heart rate noted  Respiratory: Normal respiratory effort, no problems with respiration noted  Abdomen: Soft, gravid, appropriate for gestational age. Pain/Pressure: Present     Pelvic: Vag. Bleeding: None     Cervical exam deferred        Extremities: Normal range of motion.  Edema: None  Mental Status: Normal mood and affect. Normal behavior. Normal judgment and thought content.   Urinalysis:      Assessment and Plan:  Pregnancy: G3P1011 at [redacted]w[redacted]d  1. Antepartum multigravida of advanced maternal age, third trimester  2. Previous cesarean delivery, antepartum condition or complication -sent email to schedule CS on 7/1 (39 weeks), desired repeat CS with BTL  3. Supervision of normal pregnancy, antepartum, third  trimester -updated box -discussed h/o delayed lactogensis -ordered interval growth at 37 weeks  4. Tobacco abuse Recommended decreased  Preterm labor symptoms and general obstetric precautions including but not limited to vaginal bleeding, contractions, leaking of fluid and fetal movement were reviewed in detail with the patient. Please refer to After Visit Summary for other counseling recommendations.  Return in about 2 weeks (around 04/15/2016) for Routine prenatal care.   Caren Macadam, MD

## 2016-04-03 ENCOUNTER — Encounter (HOSPITAL_COMMUNITY): Payer: Self-pay | Admitting: *Deleted

## 2016-04-05 ENCOUNTER — Other Ambulatory Visit: Payer: Self-pay | Admitting: Family Medicine

## 2016-04-08 ENCOUNTER — Ambulatory Visit (INDEPENDENT_AMBULATORY_CARE_PROVIDER_SITE_OTHER): Payer: Medicaid Other | Admitting: Student

## 2016-04-08 ENCOUNTER — Other Ambulatory Visit (HOSPITAL_COMMUNITY)
Admission: RE | Admit: 2016-04-08 | Discharge: 2016-04-08 | Disposition: A | Discharge status: Home / Self Care | Payer: Medicaid Other | Source: Ambulatory Visit | Attending: Family Medicine | Admitting: Family Medicine

## 2016-04-08 VITALS — BP 111/70 | HR 82 | Wt 246.0 lb

## 2016-04-08 DIAGNOSIS — N898 Other specified noninflammatory disorders of vagina: Secondary | ICD-10-CM

## 2016-04-08 DIAGNOSIS — Z113 Encounter for screening for infections with a predominantly sexual mode of transmission: Secondary | ICD-10-CM

## 2016-04-08 DIAGNOSIS — O26893 Other specified pregnancy related conditions, third trimester: Secondary | ICD-10-CM

## 2016-04-08 DIAGNOSIS — Z3483 Encounter for supervision of other normal pregnancy, third trimester: Secondary | ICD-10-CM | POA: Diagnosis present

## 2016-04-08 LAB — POCT URINALYSIS DIP (DEVICE)
GLUCOSE, UA: NEGATIVE mg/dL
KETONES UR: 40 mg/dL — AB
LEUKOCYTES UA: NEGATIVE
NITRITE: NEGATIVE
Protein, ur: 100 mg/dL — AB
Specific Gravity, Urine: 1.025 (ref 1.005–1.030)
Urobilinogen, UA: 1 mg/dL (ref 0.0–1.0)
pH: 6 (ref 5.0–8.0)

## 2016-04-08 NOTE — Progress Notes (Signed)
Cultures today 

## 2016-04-08 NOTE — Patient Instructions (Signed)
Cesarean Delivery Cesarean delivery is the birth of a baby through a cut (incision) in the abdomen and womb (uterus).  LET YOUR HEALTH CARE PROVIDER KNOW ABOUT:  All medicines you are taking, including vitamins, herbs, eye drops, creams, and over-the-counter medicines.  Previous problems you or members of your family have had with the use of anesthetics.  Any bleeding or blood clotting disorders you have.  Family history of blood clots or bleeding disorders.  Any history of deep vein thrombosis (DVT) or pulmonary embolism (PE).  Previous surgeries you have had.  Medical conditions you have.  Any allergies you have.  Complicationsinvolving the pregnancy. RISKS AND COMPLICATIONS  Generally, this is a safe procedure. However, as with any procedure, complications can occur. Possible complications include:  Bleeding.  Infection.  Blood clots.  Injury to surrounding organs.  Problems with anesthesia.  Injury to the baby. BEFORE THE PROCEDURE   You may be given an antacid medicine to drink. This will prevent acid contents in your stomach from going into your lungs if you vomit during the surgery.  You may be given an antibiotic medicine to prevent infection. PROCEDURE   To prevent infection of your incision:  Hair may be removed from your pubic area if it is near your incision.  The skin of your pubic area and lower abdomen will be cleaned with a germ-killing solution (antiseptic).  A tube (Foley catheter) will be placed in your bladder to drain your urine from your bladder into a bag. This keeps your bladder empty during surgery.  An IV tube will be placed in your vein.  You may be given medicine to numb the lower half of your body (regional anesthetic). If you were in labor, you may have already had an epidural in place which can be used in both labor and cesarean delivery. You may possibly be given medicine to make you sleep (general anesthetic) though this is not as  common.  Your heart rate and your baby's heart rate will be monitored.  An incision will be made in your abdomen that extends to your uterus. There are 2 basic kinds of incisions:  The horizontal (transverse) incision. Horizontal incisions are from side to side and are used for most routine cesarean deliveries.  The vertical incision. The vertical incision is from the top of the abdomen to the bottom and is less commonly used. It is often done for women who have a serious complication (extreme prematurity) or under emergency situations.  The horizontal and vertical incisions may both be used at the same time. However, this is very uncommon.  An incision is then made in your uterus to deliver the baby.  Your baby will be delivered.  Your health care provider may place the baby on your chest. It is important to keep the baby warm. Your health care provider will dry off the baby, place the baby directly on your bare skin, and cover the baby with warm, dry blankets.  Both incisions will be closed with absorbable stitches. AFTER THE PROCEDURE   If you were awake during the surgery, you will see your baby right away. If you were asleep, you will see your baby as soon as you are awake.  You may breastfeed your baby after surgery.  You may be able to get up and walk the same day as the surgery. If you need to stay in bed for a period of time, you will receive help to turn, cough, and take deep breaths after   surgery. This helps prevent lung problems such as pneumonia.  Do not get out of bed alone the first time after surgery. You will need help getting out of bed until you are able to do this by yourself.  You may be able to shower the day after your cesarean delivery. After the bandage (dressing) is taken off the incision site, a nurse will assist you to shower if you would like help.  You may be directed to take actions to help prevent blood clots in your legs. These may  include:  Walking shortly after surgery, with someone assisting you. Moving around after surgery helps to improve blood flow.  Wearing compression stockings or using different types of devices.  Taking medicines to thin your blood (anticoagulants) if you are at high risk for DVT or PE.  Save any blood clots that you pass from your vagina. If you pass a clot while on the toilet, do not flush it. Call for the nurse. Tell the nurse if you think you are bleeding too much or passing too many clots.  You will be given medicine for pain and nausea as needed. Let your health care providers know if you are hurting. You may also be given an antibiotic to prevent an infection.  Your IV tube will be taken out when you are drinking a reasonable amount of fluids. The Foley catheter is taken out when you are up and walking.  If your blood type is Rh negative and your baby's blood type is Rh positive, you will be given a shot of anti-D immune globulin. This shot prevents you from having Rh problems with a future pregnancy. You should get the shot even if you had your tubes tied (tubal ligation).  If you are allowed to take the baby for a walk, place the baby in the bassinet and push it.   This information is not intended to replace advice given to you by your health care provider. Make sure you discuss any questions you have with your health care provider.   Document Released: 10/20/2005 Document Revised: 07/11/2015 Document Reviewed: 06/16/2012 Elsevier Interactive Patient Education 2016 [employer]    [employer] of the uterus can occur throughout pregnancy. Contractions are not always a sign that you are in labor.  WHAT ARE BRAXTON HICKS CONTRACTIONS?  Contractions that occur before labor are called Braxton Hicks contractions, or false labor. Toward the end of pregnancy (32-34 weeks), these contractions can develop more often and may become more forceful. This is not  true labor because these contractions do not result in opening (dilatation) and thinning of the cervix. They are sometimes difficult to tell apart from true labor because these contractions can be forceful and people have different pain tolerances. You should not feel embarrassed if you go to the hospital with false labor. Sometimes, the only way to tell if you are in true labor is for your health care provider to look for changes in the cervix. If there are no prenatal problems or other health problems associated with the pregnancy, it is completely safe to be sent home with false labor and await the onset of true labor. HOW CAN YOU TELL THE DIFFERENCE BETWEEN TRUE AND FALSE LABOR? False Labor  The contractions of false labor are usually shorter and not as hard as those of true labor.   The contractions are usually irregular.   The contractions are often felt in the front of the lower abdomen and in the groin.  The contractions may go away when you walk around or change positions while lying down.   The contractions get weaker and are shorter lasting as time goes on.   The contractions do not usually become progressively stronger, regular, and closer together as with true labor.  True Labor  Contractions in true labor last 30-70 seconds, become very regular, usually become more intense, and increase in frequency.   The contractions do not go away with walking.   The discomfort is usually felt in the top of the uterus and spreads to the lower abdomen and low back.   True labor can be determined by your health care provider with an exam. This will show that the cervix is dilating and getting thinner.  WHAT TO REMEMBER  Keep up with your usual exercises and follow other instructions given by your health care provider.   Take medicines as directed by your health care provider.   Keep your regular prenatal appointments.   Eat and drink lightly if you think you are going  into labor.   If Braxton Hicks contractions are making you uncomfortable:   Change your position from lying down or resting to walking, or from walking to resting.   Sit and rest in a tub of warm water.   Drink 2-3 glasses of water. Dehydration may cause these contractions.   Do slow and deep breathing several times an hour.  WHEN SHOULD I SEEK IMMEDIATE MEDICAL CARE? Seek immediate medical care if:  Your contractions become stronger, more regular, and closer together.   You have fluid leaking or gushing from your vagina.   You have a fever.   You pass blood-tinged mucus.   You have vaginal bleeding.   You have continuous abdominal pain.   You have low back pain that you never had before.   You feel your baby's head pushing down and causing pelvic pressure.   Your baby is not moving as much as it used to.    This information is not intended to replace advice given to you by your health care provider. Make sure you discuss any questions you have with your health care provider.   Document Released: 10/20/2005 Document Revised: 10/25/2013 Document Reviewed: 08/01/2013 Elsevier Interactive Patient Education [employer].

## 2016-04-08 NOTE — Progress Notes (Signed)
Subjective:  Sarah Mueller is a 40 y.o. G3P1011 at [redacted]w[redacted]d being seen today for ongoing prenatal care.  She is currently monitored for the following issues for this high-risk pregnancy and has History of acute pancreatitis; Obesity, unspecified; Tobacco abuse; Supervision of normal pregnancy, antepartum; Previous cesarean delivery, antepartum condition or complication; Antepartum multigravida of advanced maternal age; and Maternal obesity, antepartum on her problem list.  Patient reports vaginal irritation.  Contractions: Not present. Vag. Bleeding: None.  Movement: Present. Denies leaking of fluid.   The following portions of the patient's history were reviewed and updated as appropriate: allergies, current medications, past family history, past medical history, past social history, past surgical history and problem list. Problem list updated.  Objective:   Filed Vitals:   04/08/16 0758  BP: 111/70  Pulse: 82  Weight: 246 lb (111.585 kg)    Fetal Status: Fetal Heart Rate (bpm): 136 Fundal Height: 37 cm Movement: Present  Presentation: Vertex  General:  Alert, oriented and cooperative. Patient is in no acute distress.  Skin: Skin is warm and dry. No rash noted.   Cardiovascular: Normal heart rate noted  Respiratory: Normal respiratory effort, no problems with respiration noted  Abdomen: Soft, gravid, appropriate for gestational age. Pain/Pressure: Present     Pelvic: Vag. Bleeding: None Vag D/C Character: White   Cervical exam performed Dilation: Fingertip Effacement (%): Thick Station: -3  Extremities: Normal range of motion.  Edema: None  Mental Status: Normal mood and affect. Normal behavior. Normal judgment and thought content.   Urinalysis:      Assessment and Plan:  Pregnancy: G3P1011 at [redacted]w[redacted]d  1. Supervision of normal pregnancy, antepartum, third trimester  - GC/Chlamydia probe amp ()not at [facility] - Wet prep, genital  2. Vaginal discharge during pregnancy in  third trimester -wet prep -pt opted to used OTC tx  Term labor symptoms and general obstetric precautions including but not limited to vaginal bleeding, contractions, leaking of fluid and fetal movement were reviewed in detail with the patient. Please refer to After Visit Summary for other counseling recommendations.  Return in about 1 week (around 04/15/2016) for Routine OB.   Jorje Guild, NP

## 2016-04-09 ENCOUNTER — Other Ambulatory Visit: Payer: Self-pay | Admitting: Student

## 2016-04-09 ENCOUNTER — Telehealth: Payer: Self-pay | Admitting: *Deleted

## 2016-04-09 DIAGNOSIS — N760 Acute vaginitis: Principal | ICD-10-CM

## 2016-04-09 DIAGNOSIS — B9689 Other specified bacterial agents as the cause of diseases classified elsewhere: Secondary | ICD-10-CM

## 2016-04-09 LAB — GC/CHLAMYDIA PROBE AMP (~~LOC~~) NOT AT ARMC
Chlamydia: NEGATIVE
NEISSERIA GONORRHEA: NEGATIVE

## 2016-04-09 LAB — WET PREP, GENITAL
Trich, Wet Prep: NONE SEEN
YEAST WET PREP: NONE SEEN

## 2016-04-09 MED ORDER — METRONIDAZOLE 500 MG PO TABS: 500.0000 mg | ORAL_TABLET | Freq: Two times a day (BID) | ORAL | Status: DC

## 2016-04-09 NOTE — Telephone Encounter (Signed)
Attempted to call patient, there was no answer. Voice mail left stating I am calling with nonurgent test results, please return my call at the clinics.

## 2016-04-09 NOTE — Telephone Encounter (Signed)
-----   Message from Jorje Guild, NP sent at 04/09/2016  8:22 AM EDT ----- Flagyl sent in for BV. Please let pt know.  Thanks!

## 2016-04-10 ENCOUNTER — Telehealth: Payer: Self-pay | Admitting: General Practice

## 2016-04-10 NOTE — Telephone Encounter (Signed)
Patient called back into front office & I informed her of results & medication at pharmacy. Patient verbalized understanding & had no questions

## 2016-04-10 NOTE — Telephone Encounter (Signed)
Called patient & her mother answered stating she wasn't in right now but was at work. Told her to let Laihla known we would send her a mychart message with results. She verbalized understanding & stated she would. Will send mychart message.

## 2016-04-15 ENCOUNTER — Ambulatory Visit (INDEPENDENT_AMBULATORY_CARE_PROVIDER_SITE_OTHER): Payer: Medicaid Other | Admitting: Family Medicine

## 2016-04-15 VITALS — BP 122/76 | HR 80 | Temp 98.3°F | Wt 254.8 lb

## 2016-04-15 DIAGNOSIS — O34219 Maternal care for unspecified type scar from previous cesarean delivery: Secondary | ICD-10-CM

## 2016-04-15 DIAGNOSIS — Z3490 Encounter for supervision of normal pregnancy, unspecified, unspecified trimester: Secondary | ICD-10-CM

## 2016-04-15 DIAGNOSIS — O09523 Supervision of elderly multigravida, third trimester: Secondary | ICD-10-CM

## 2016-04-15 DIAGNOSIS — Z3483 Encounter for supervision of other normal pregnancy, third trimester: Secondary | ICD-10-CM

## 2016-04-15 LAB — POCT URINALYSIS DIP (DEVICE)
Bilirubin Urine: NEGATIVE
Glucose, UA: NEGATIVE mg/dL
Hgb urine dipstick: NEGATIVE
Ketones, ur: 40 mg/dL — AB
Nitrite: NEGATIVE
Protein, ur: NEGATIVE mg/dL
Specific Gravity, Urine: 1.015 (ref 1.005–1.030)
Urobilinogen, UA: 0.2 mg/dL (ref 0.0–1.0)
pH: 7 (ref 5.0–8.0)

## 2016-04-15 LAB — OB RESULTS CONSOLE GBS: STREP GROUP B AG: NEGATIVE

## 2016-04-15 NOTE — Progress Notes (Signed)
Trace of Leukocytes and 40 of Ketones found in urine at this visit.

## 2016-04-15 NOTE — Patient Instructions (Signed)
Breastfeeding Deciding to breastfeed is one of the best choices you can make for you and your baby. A change in hormones during pregnancy causes your breast tissue to grow and increases the number and size of your milk ducts. These hormones also allow proteins, sugars, and fats from your blood supply to make breast milk in your milk-producing glands. Hormones prevent breast milk from being released before your baby is born as well as prompt milk flow after birth. Once breastfeeding has begun, thoughts of your baby, as well as his or her sucking or crying, can stimulate the release of milk from your milk-producing glands.  BENEFITS OF BREASTFEEDING For Your Baby  Your first milk (colostrum) helps your baby's digestive system function better.  There are antibodies in your milk that help your baby fight off infections.  Your baby has a lower incidence of asthma, allergies, and sudden infant death syndrome.  The nutrients in breast milk are better for your baby than infant formulas and are designed uniquely for your baby's needs.  Breast milk improves your baby's brain development.  Your baby is less likely to develop other conditions, such as childhood obesity, asthma, or type 2 diabetes mellitus. For You  Breastfeeding helps to create a very special bond between you and your baby.  Breastfeeding is convenient. Breast milk is always available at the correct temperature and costs nothing.  Breastfeeding helps to burn calories and helps you lose the weight gained during pregnancy.  Breastfeeding makes your uterus contract to its prepregnancy size faster and slows bleeding (lochia) after you give birth.   Breastfeeding helps to lower your risk of developing type 2 diabetes mellitus, osteoporosis, and breast or ovarian cancer later in life. SIGNS THAT YOUR BABY IS HUNGRY Early Signs of Hunger  Increased alertness or activity.  Stretching.  Movement of the head from side to  side.  Movement of the head and opening of the mouth when the corner of the mouth or cheek is stroked (rooting).  Increased sucking sounds, smacking lips, cooing, sighing, or squeaking.  Hand-to-mouth movements.  Increased sucking of fingers or hands. Late Signs of Hunger  Fussing.  Intermittent crying. Extreme Signs of Hunger Signs of extreme hunger will require calming and consoling before your baby will be able to breastfeed successfully. Do not wait for the following signs of extreme hunger to occur before you initiate breastfeeding:  Restlessness.  A loud, strong cry.  Screaming. BREASTFEEDING BASICS Breastfeeding Initiation  Find a comfortable place to sit or lie down, with your neck and back well supported.  Place a pillow or rolled up blanket under your baby to bring him or her to the level of your breast (if you are seated). Nursing pillows are specially designed to help support your arms and your baby while you breastfeed.  Make sure that your baby's abdomen is facing your abdomen.  Gently massage your breast. With your fingertips, massage from your chest wall toward your nipple in a circular motion. This encourages milk flow. You may need to continue this action during the feeding if your milk flows slowly.  Support your breast with 4 fingers underneath and your thumb above your nipple. Make sure your fingers are well away from your nipple and your baby's mouth.  Stroke your baby's lips gently with your finger or nipple.  When your baby's mouth is open wide enough, quickly bring your baby to your breast, placing your entire nipple and as much of the colored area around your nipple (  areola) as possible into your baby's mouth.  More areola should be visible above your baby's upper lip than below the lower lip.  Your baby's tongue should be between his or her lower gum and your breast.  Ensure that your baby's mouth is correctly positioned around your nipple  (latched). Your baby's lips should create a seal on your breast and be turned out (everted).  It is common for your baby to suck about 2-3 minutes in order to start the flow of breast milk. Latching Teaching your baby how to latch on to your breast properly is very important. An improper latch can cause nipple pain and decreased milk supply for you and poor weight gain in your baby. Also, if your baby is not latched onto your nipple properly, he or she may swallow some air during feeding. This can make your baby fussy. Burping your baby when you switch breasts during the feeding can help to get rid of the air. However, teaching your baby to latch on properly is still the best way to prevent fussiness from swallowing air while breastfeeding. Signs that your baby has successfully latched on to your nipple:  Silent tugging or silent sucking, without causing you pain.  Swallowing heard between every 3-4 sucks.  Muscle movement above and in front of his or her ears while sucking. Signs that your baby has not successfully latched on to nipple:  Sucking sounds or smacking sounds from your baby while breastfeeding.  Nipple pain. If you think your baby has not latched on correctly, slip your finger into the corner of your baby's mouth to break the suction and place it between your baby's gums. Attempt breastfeeding initiation again. Signs of Successful Breastfeeding Signs from your baby:  A gradual decrease in the number of sucks or complete cessation of sucking.  Falling asleep.  Relaxation of his or her body.  Retention of a small amount of milk in his or her mouth.  Letting go of your breast by himself or herself. Signs from you:  Breasts that have increased in firmness, weight, and size 1-3 hours after feeding.  Breasts that are softer immediately after breastfeeding.  Increased milk volume, as well as a change in milk consistency and color by the fifth day of breastfeeding.  Nipples  that are not sore, cracked, or bleeding. Signs That Your Baby is Getting Enough Milk  Wetting at least 3 diapers in a 24-hour period. The urine should be clear and pale yellow by age 5 days.  At least 3 stools in a 24-hour period by age 5 days. The stool should be soft and yellow.  At least 3 stools in a 24-hour period by age 7 days. The stool should be seedy and yellow.  No loss of weight greater than 10% of birth weight during the first 3 days of age.  Average weight gain of 4-7 ounces (113-198 g) per week after age 4 days.  Consistent daily weight gain by age 5 days, without weight loss after the age of 2 weeks. After a feeding, your baby may spit up a small amount. This is common. BREASTFEEDING FREQUENCY AND DURATION Frequent feeding will help you make more milk and can prevent sore nipples and breast engorgement. Breastfeed when you feel the need to reduce the fullness of your breasts or when your baby shows signs of hunger. This is called "breastfeeding on demand." Avoid introducing a pacifier to your baby while you are working to establish breastfeeding (the first 4-6 weeks   after your baby is born). After this time you may choose to use a pacifier. Research has shown that pacifier use during the first year of a baby's life decreases the risk of sudden infant death syndrome (SIDS). Allow your baby to feed on each breast as long as he or she wants. Breastfeed until your baby is finished feeding. When your baby unlatches or falls asleep while feeding from the first breast, offer the second breast. Because newborns are often sleepy in the first few weeks of life, you may need to awaken your baby to get him or her to feed. Breastfeeding times will vary from baby to baby. However, the following rules can serve as a guide to help you ensure that your baby is properly fed:  Newborns (babies 4 weeks of age or younger) may breastfeed every 1-3 hours.  Newborns should not go longer than 3 hours  during the day or 5 hours during the night without breastfeeding.  You should breastfeed your baby a minimum of 8 times in a 24-hour period until you begin to introduce solid foods to your baby at around 6 months of age. BREAST MILK PUMPING Pumping and storing breast milk allows you to ensure that your baby is exclusively fed your breast milk, even at times when you are unable to breastfeed. This is especially important if you are going back to work while you are still breastfeeding or when you are not able to be present during feedings. Your lactation consultant can give you guidelines on how long it is safe to store breast milk. A breast pump is a machine that allows you to pump milk from your breast into a sterile bottle. The pumped breast milk can then be stored in a refrigerator or freezer. Some breast pumps are operated by hand, while others use electricity. Ask your lactation consultant which type will work best for you. Breast pumps can be purchased, but some hospitals and breastfeeding support groups lease breast pumps on a monthly basis. A lactation consultant can teach you how to hand express breast milk, if you prefer not to use a pump. CARING FOR YOUR BREASTS WHILE YOU BREASTFEED Nipples can become dry, cracked, and sore while breastfeeding. The following recommendations can help keep your breasts moisturized and healthy:  Avoid using soap on your nipples.  Wear a supportive bra. Although not required, special nursing bras and tank tops are designed to allow access to your breasts for breastfeeding without taking off your entire bra or top. Avoid wearing underwire-style bras or extremely tight bras.  Air dry your nipples for 3-4minutes after each feeding.  Use only cotton bra pads to absorb leaked breast milk. Leaking of breast milk between feedings is normal.  Use lanolin on your nipples after breastfeeding. Lanolin helps to maintain your skin's normal moisture barrier. If you use  pure lanolin, you do not need to wash it off before feeding your baby again. Pure lanolin is not toxic to your baby. You may also hand express a few drops of breast milk and gently massage that milk into your nipples and allow the milk to air dry. In the first few weeks after giving birth, some women experience extremely full breasts (engorgement). Engorgement can make your breasts feel heavy, warm, and tender to the touch. Engorgement peaks within 3-5 days after you give birth. The following recommendations can help ease engorgement:  Completely empty your breasts while breastfeeding or pumping. You may want to start by applying warm, moist heat (in   the shower or with warm water-soaked hand towels) just before feeding or pumping. This increases circulation and helps the milk flow. If your baby does not completely empty your breasts while breastfeeding, pump any extra milk after he or she is finished.  Wear a snug bra (nursing or regular) or tank top for 1-2 days to signal your body to slightly decrease milk production.  Apply ice packs to your breasts, unless this is too uncomfortable for you.  Make sure that your baby is latched on and positioned properly while breastfeeding. If engorgement persists after 48 hours of following these recommendations, contact your health care provider or a lactation consultant. OVERALL HEALTH CARE RECOMMENDATIONS WHILE BREASTFEEDING  Eat healthy foods. Alternate between meals and snacks, eating 3 of each per day. Because what you eat affects your breast milk, some of the foods may make your baby more irritable than usual. Avoid eating these foods if you are sure that they are negatively affecting your baby.  Drink milk, fruit juice, and water to satisfy your thirst (about 10 glasses a day).  Rest often, relax, and continue to take your prenatal vitamins to prevent fatigue, stress, and anemia.  Continue breast self-awareness checks.  Avoid chewing and smoking  tobacco. Chemicals from cigarettes that pass into breast milk and exposure to secondhand smoke may harm your baby.  Avoid alcohol and drug use, including marijuana. Some medicines that may be harmful to your baby can pass through breast milk. It is important to ask your health care provider before taking any medicine, including all over-the-counter and prescription medicine as well as vitamin and herbal supplements. It is possible to become pregnant while breastfeeding. If birth control is desired, ask your health care provider about options that will be safe for your baby. SEEK MEDICAL CARE IF:  You feel like you want to stop breastfeeding or have become frustrated with breastfeeding.  You have painful breasts or nipples.  Your nipples are cracked or bleeding.  Your breasts are red, tender, or warm.  You have a swollen area on either breast.  You have a fever or chills.  You have nausea or vomiting.  You have drainage other than breast milk from your nipples.  Your breasts do not become full before feedings by the fifth day after you give birth.  You feel sad and depressed.  Your baby is too sleepy to eat well.  Your baby is having trouble sleeping.   Your baby is wetting less than 3 diapers in a 24-hour period.  Your baby has less than 3 stools in a 24-hour period.  Your baby's skin or the white part of his or her eyes becomes yellow.   Your baby is not gaining weight by 5 days of age. SEEK IMMEDIATE MEDICAL CARE IF:  Your baby is overly tired (lethargic) and does not want to wake up and feed.  Your baby develops an unexplained fever.   This information is not intended to replace advice given to you by your health care provider. Make sure you discuss any questions you have with your health care provider.   Document Released: 10/20/2005 Document Revised: 07/11/2015 Document Reviewed: 04/13/2013 Elsevier Interactive Patient Education 2016 [employer]  

## 2016-04-15 NOTE — Progress Notes (Signed)
Subjective:  Sarah Mueller is a 40 y.o. G3P1011 at [redacted]w[redacted]d being seen today for ongoing prenatal care.  She is currently monitored for the following issues for this low-risk pregnancy and has History of acute pancreatitis; Obesity, unspecified; Tobacco abuse; Supervision of normal pregnancy, antepartum; Previous cesarean delivery, antepartum condition or complication; Antepartum multigravida of advanced maternal age; and Maternal obesity, antepartum on her problem list.  Patient reports no complaints.  Contractions: Irritability. Vag. Bleeding: None.  Movement: Present. Denies leaking of fluid.   The following portions of the patient's history were reviewed and updated as appropriate: allergies, current medications, past family history, past medical history, past social history, past surgical history and problem list. Problem list updated.  Objective:   Filed Vitals:   04/15/16 0755  BP: 122/76  Pulse: 80  Temp: 98.3 F (36.8 C)  Weight: 254 lb 12.8 oz (115.577 kg)    Fetal Status: Fetal Heart Rate (bpm): 145 Fundal Height: 38 cm Movement: Present  Presentation: Vertex  General:  Alert, oriented and cooperative. Patient is in no acute distress.  Skin: Skin is warm and dry. No rash noted.   Cardiovascular: Normal heart rate noted  Respiratory: Normal respiratory effort, no problems with respiration noted  Abdomen: Soft, gravid, appropriate for gestational age. Pain/Pressure: Present     Pelvic: Cervical exam deferred        Extremities: Normal range of motion.  Edema: None  Mental Status: Normal mood and affect. Normal behavior. Normal judgment and thought content.   Urinalysis: Urine Protein: Trace Urine Glucose: Negative  Assessment and Plan:  Pregnancy: G3P1011 at [redacted]w[redacted]d  1. Pregnancy GBS today - Culture, beta strep (group b only)  2. Supervision of normal pregnancy, antepartum, third trimester Continue routine prenatal care.   3. Previous cesarean delivery, antepartum  condition or complication Scheduled for repeat and BTL  4. Antepartum multigravida of advanced maternal age, third trimester   Term labor symptoms and general obstetric precautions including but not limited to vaginal bleeding, contractions, leaking of fluid and fetal movement were reviewed in detail with the patient. Please refer to After Visit Summary for other counseling recommendations.  Return in 1 week (on 04/22/2016).   Donnamae Jude, MD

## 2016-04-17 LAB — CULTURE, BETA STREP (GROUP B ONLY)

## 2016-04-20 ENCOUNTER — Encounter: Payer: Self-pay | Admitting: Student

## 2016-04-20 DIAGNOSIS — B379 Candidiasis, unspecified: Secondary | ICD-10-CM

## 2016-04-21 MED ORDER — TERCONAZOLE 0.4 % VA CREA: 1.0000 | TOPICAL_CREAM | Freq: Every day | VAGINAL | Status: AC

## 2016-04-22 ENCOUNTER — Encounter: Payer: Medicaid Other | Admitting: Advanced Practice Midwife

## 2016-04-22 ENCOUNTER — Other Ambulatory Visit: Payer: Self-pay | Admitting: Family Medicine

## 2016-04-22 ENCOUNTER — Ambulatory Visit (HOSPITAL_COMMUNITY)
Admission: RE | Admit: 2016-04-22 | Discharge: 2016-04-22 | Disposition: A | Discharge status: Home / Self Care | Payer: Medicaid Other | Source: Ambulatory Visit | Attending: Family Medicine | Admitting: Family Medicine

## 2016-04-22 DIAGNOSIS — O09523 Supervision of elderly multigravida, third trimester: Secondary | ICD-10-CM

## 2016-04-22 DIAGNOSIS — O3413 Maternal care for benign tumor of corpus uteri, third trimester: Secondary | ICD-10-CM | POA: Insufficient documentation

## 2016-04-22 DIAGNOSIS — O34219 Maternal care for unspecified type scar from previous cesarean delivery: Secondary | ICD-10-CM

## 2016-04-22 DIAGNOSIS — D259 Leiomyoma of uterus, unspecified: Secondary | ICD-10-CM | POA: Insufficient documentation

## 2016-04-22 DIAGNOSIS — Z0489 Encounter for examination and observation for other specified reasons: Secondary | ICD-10-CM

## 2016-04-22 DIAGNOSIS — Z3A37 37 weeks gestation of pregnancy: Secondary | ICD-10-CM | POA: Insufficient documentation

## 2016-04-22 DIAGNOSIS — IMO0002 Reserved for concepts with insufficient information to code with codable children: Secondary | ICD-10-CM

## 2016-04-22 DIAGNOSIS — Z3483 Encounter for supervision of other normal pregnancy, third trimester: Secondary | ICD-10-CM

## 2016-04-22 DIAGNOSIS — O0933 Supervision of pregnancy with insufficient antenatal care, third trimester: Secondary | ICD-10-CM

## 2016-04-22 DIAGNOSIS — O3410 Maternal care for benign tumor of corpus uteri, unspecified trimester: Secondary | ICD-10-CM

## 2016-04-22 DIAGNOSIS — O99213 Obesity complicating pregnancy, third trimester: Secondary | ICD-10-CM

## 2016-04-24 ENCOUNTER — Other Ambulatory Visit: Payer: Self-pay | Admitting: Family Medicine

## 2016-04-28 ENCOUNTER — Telehealth (HOSPITAL_COMMUNITY): Payer: Self-pay | Admitting: *Deleted

## 2016-04-28 NOTE — Telephone Encounter (Signed)
Preadmission screen  

## 2016-04-29 ENCOUNTER — Telehealth (HOSPITAL_COMMUNITY): Payer: Self-pay | Admitting: *Deleted

## 2016-04-29 ENCOUNTER — Ambulatory Visit (INDEPENDENT_AMBULATORY_CARE_PROVIDER_SITE_OTHER): Payer: Medicaid Other | Admitting: Student

## 2016-04-29 ENCOUNTER — Encounter: Payer: Self-pay | Admitting: Student

## 2016-04-29 ENCOUNTER — Telehealth: Payer: Self-pay | Admitting: Clinical

## 2016-04-29 VITALS — BP 136/86 | HR 84 | Wt 251.5 lb

## 2016-04-29 DIAGNOSIS — Z3483 Encounter for supervision of other normal pregnancy, third trimester: Secondary | ICD-10-CM

## 2016-04-29 LAB — POCT URINALYSIS DIP (DEVICE)
Glucose, UA: NEGATIVE mg/dL
Hgb urine dipstick: NEGATIVE
Ketones, ur: NEGATIVE mg/dL
Nitrite: NEGATIVE
Protein, ur: 100 mg/dL — AB
Specific Gravity, Urine: 1.02 (ref 1.005–1.030)
Urobilinogen, UA: 1 mg/dL (ref 0.0–1.0)
pH: 7 (ref 5.0–8.0)

## 2016-04-29 NOTE — Telephone Encounter (Signed)
Left HIPPA-compliant message to return call to Doneta Bayman from [facility] at [phone]. 

## 2016-04-29 NOTE — Progress Notes (Signed)
Subjective:  Sarah Mueller is a 40 y.o. G3P1011 at [redacted]w[redacted]d being seen today for ongoing prenatal care.  She is currently monitored for the following issues for this high-risk pregnancy and has History of acute pancreatitis; Obesity, unspecified; Tobacco abuse; Supervision of normal pregnancy, antepartum; Previous cesarean delivery, antepartum condition or complication; Antepartum multigravida of advanced maternal age; Maternal obesity, antepartum; and Uterine fibroid complicating antenatal care, baby not yet delivered on her problem list.  Patient reports mood swings. Alternating feelings of happiness & sadness over the last few weeks. Denies SI/HI. Hx of depression on meds "a long long time ago". Contractions: Not present. Vag. Bleeding: None.  Movement: Present. Denies leaking of fluid.   The following portions of the patient's history were reviewed and updated as appropriate: allergies, current medications, past family history, past medical history, past social history, past surgical history and problem list. Problem list updated.  Objective:   Filed Vitals:   04/29/16 0801  BP: 136/86  Pulse: 84  Weight: 251 lb 8 oz (114.08 kg)    Fetal Status: Fetal Heart Rate (bpm): 154   Movement: Present     General:  Alert, oriented and cooperative. Patient is in no acute distress.  Skin: Skin is warm and dry. No rash noted.   Cardiovascular: Normal heart rate noted  Respiratory: Normal respiratory effort, no problems with respiration noted  Abdomen: Soft, gravid, appropriate for gestational age. Pain/Pressure: Present   FH 41cm  Pelvic: Cervical exam deferred        Extremities: Normal range of motion.  Edema: None  Mental Status: Normal mood and affect. Normal behavior. Normal judgment and thought content.   Urinalysis: Urine Protein: 2+ Urine Glucose: Negative  Assessment and Plan:  Pregnancy: G3P1011 at [redacted]w[redacted]d  1. Supervision of normal pregnancy, antepartum, third trimester   Term  labor symptoms and general obstetric precautions including but not limited to vaginal bleeding, contractions, leaking of fluid and fetal movement were reviewed in detail with the patient. Please refer to After Visit Summary for other counseling recommendations.  Return in about 1 week (around 05/06/2016) for Routine OB.   Jorje Guild, NP

## 2016-04-29 NOTE — Telephone Encounter (Signed)
Preadmission screen  

## 2016-04-29 NOTE — Patient Instructions (Signed)
Braxton Hicks Contractions Contractions of the uterus can occur throughout pregnancy. Contractions are not always a sign that you are in labor.  WHAT ARE BRAXTON HICKS CONTRACTIONS?  Contractions that occur before labor are called Braxton Hicks contractions, or false labor. Toward the end of pregnancy (32-34 weeks), these contractions can develop more often and may become more forceful. This is not true labor because these contractions do not result in opening (dilatation) and thinning of the cervix. They are sometimes difficult to tell apart from true labor because these contractions can be forceful and people have different pain tolerances. You should not feel embarrassed if you go to the hospital with false labor. Sometimes, the only way to tell if you are in true labor is for your health care provider to look for changes in the cervix. If there are no prenatal problems or other health problems associated with the pregnancy, it is completely safe to be sent home with false labor and await the onset of true labor. HOW CAN YOU TELL THE DIFFERENCE BETWEEN TRUE AND FALSE LABOR? False Labor  The contractions of false labor are usually shorter and not as hard as those of true labor.   The contractions are usually irregular.   The contractions are often felt in the front of the lower abdomen and in the groin.   The contractions may go away when you walk around or change positions while lying down.   The contractions get weaker and are shorter lasting as time goes on.   The contractions do not usually become progressively stronger, regular, and closer together as with true labor.  True Labor 1. Contractions in true labor last 30-70 seconds, become very regular, usually become more intense, and increase in frequency.  2. The contractions do not go away with walking.  3. The discomfort is usually felt in the top of the uterus and spreads to the lower abdomen and low back.  4. True labor can  be determined by your health care provider with an exam. This will show that the cervix is dilating and getting thinner.  WHAT TO REMEMBER  Keep up with your usual exercises and follow other instructions given by your health care provider.   Take medicines as directed by your health care provider.   Keep your regular prenatal appointments.   Eat and drink lightly if you think you are going into labor.   If Braxton Hicks contractions are making you uncomfortable:   Change your position from lying down or resting to walking, or from walking to resting.   Sit and rest in a tub of warm water.   Drink 2-3 glasses of water. Dehydration may cause these contractions.   Do slow and deep breathing several times an hour.  WHEN SHOULD I SEEK IMMEDIATE MEDICAL CARE? Seek immediate medical care if:  Your contractions become stronger, more regular, and closer together.   You have fluid leaking or gushing from your vagina.   You have a fever.   You pass blood-tinged mucus.   You have vaginal bleeding.   You have continuous abdominal pain.   You have low back pain that you never had before.   You feel your baby's head pushing down and causing pelvic pressure.   Your baby is not moving as much as it used to.    This information is not intended to replace advice given to you by your health care provider. Make sure you discuss any questions you have with your health care  provider. °  °Document Released: 10/20/2005 Document Revised: 10/25/2013 Document Reviewed: 08/01/2013 °Elsevier Interactive Patient Education ©2016 [employer] ° °Fetal Movement Counts °Patient Name: __________________________________________________ Patient Due Date: ____________________ °Performing a fetal movement count is highly recommended in high-risk pregnancies, but it is good for every pregnant woman to do. Your health care provider may ask you to start counting fetal movements at 28 weeks of the  pregnancy. Fetal movements often increase: °· After eating a full meal. °· After physical activity. °· After eating or drinking something sweet or cold. °· At rest. °Pay attention to when you feel the baby is most active. This will help you notice a pattern of your baby's sleep and wake cycles and what factors contribute to an increase in fetal movement. It is important to perform a fetal movement count at the same time each day when your baby is normally most active.  °HOW TO COUNT FETAL MOVEMENTS °5. Find a quiet and comfortable area to sit or lie down on your left side. Lying on your left side provides the best blood and oxygen circulation to your baby. °6. Write down the day and time on a sheet of paper or in a journal. °7. Start counting kicks, flutters, swishes, rolls, or jabs in a 2-hour period. You should feel at least 10 movements within 2 hours. °8. If you do not feel 10 movements in 2 hours, wait 2-3 hours and count again. Look for a change in the pattern or not enough counts in 2 hours. °SEEK MEDICAL CARE IF: °· You feel less than 10 counts in 2 hours, tried twice. °· There is no movement in over an hour. °· The pattern is changing or taking longer each day to reach 10 counts in 2 hours. °· You feel the baby is not moving as he or she usually does. °Date: ____________ Movements: ____________ Start time: ____________ Finish time: ____________  °Date: ____________ Movements: ____________ Start time: ____________ Finish time: ____________ °Date: ____________ Movements: ____________ Start time: ____________ Finish time: ____________ °Date: ____________ Movements: ____________ Start time: ____________ Finish time: ____________ °Date: ____________ Movements: ____________ Start time: ____________ Finish time: ____________ °Date: ____________ Movements: ____________ Start time: ____________ Finish time: ____________ °Date: ____________ Movements: ____________ Start time: ____________ Finish time:  ____________ °Date: ____________ Movements: ____________ Start time: ____________ Finish time: ____________  °Date: ____________ Movements: ____________ Start time: ____________ Finish time: ____________ °Date: ____________ Movements: ____________ Start time: ____________ Finish time: ____________ °Date: ____________ Movements: ____________ Start time: ____________ Finish time: ____________ °Date: ____________ Movements: ____________ Start time: ____________ Finish time: ____________ °Date: ____________ Movements: ____________ Start time: ____________ Finish time: ____________ °Date: ____________ Movements: ____________ Start time: ____________ Finish time: ____________ °Date: ____________ Movements: ____________ Start time: ____________ Finish time: ____________  °Date: ____________ Movements: ____________ Start time: ____________ Finish time: ____________ °Date: ____________ Movements: ____________ Start time: ____________ Finish time: ____________ °Date: ____________ Movements: ____________ Start time: ____________ Finish time: ____________ °Date: ____________ Movements: ____________ Start time: ____________ Finish time: ____________ °Date: ____________ Movements: ____________ Start time: ____________ Finish time: ____________ °Date: ____________ Movements: ____________ Start time: ____________ Finish time: ____________ °Date: ____________ Movements: ____________ Start time: ____________ Finish time: ____________  °Date: ____________ Movements: ____________ Start time: ____________ Finish time: ____________ °Date: ____________ Movements: ____________ Start time: ____________ Finish time: ____________ °Date: ____________ Movements: ____________ Start time: ____________ Finish time: ____________ °Date: ____________ Movements: ____________ Start time: ____________ Finish time: ____________ °Date: ____________ Movements: ____________ Start time: ____________ Finish time: ____________ °Date: ____________ Movements:  ____________ Start time: ____________ Finish   ____________ Sarah Mueller time: ____________ Date: ____________ Movements: ____________ Start time: ____________ Sarah Mueller time: ____________  Date: ____________ Movements: ____________ Start time: ____________ Sarah Mueller time: ____________ Date: ____________ Movements: ____________ Start time: ____________ Sarah Mueller time: ____________ Date: ____________ Movements: ____________ Start time: ____________ Sarah Mueller time: ____________ Date: ____________ Movements: ____________ Start time: ____________ Sarah Mueller time: ____________ Date: ____________ Movements: ____________ Start time: ____________ Sarah Mueller time: ____________ Date: ____________ Movements: ____________ Start time: ____________ Sarah Mueller time: ____________ Date: ____________ Movements: ____________ Start time: ____________ Sarah Mueller time: ____________  Date: ____________ Movements: ____________ Start time: ____________ Sarah Mueller time: ____________ Date: ____________ Movements: ____________ Start time: ____________ Sarah Mueller time: ____________ Date: ____________ Movements: ____________ Start time: ____________ Sarah Mueller time: ____________ Date: ____________ Movements: ____________ Start time: ____________ Sarah Mueller time: ____________ Date: ____________ Movements: ____________ Start time: ____________ Sarah Mueller time: ____________ Date: ____________ Movements: ____________ Start time: ____________ Sarah Mueller time: ____________ Date: ____________ Movements: ____________ Start time: ____________ Sarah Mueller time: ____________  Date: ____________ Movements: ____________ Start time: ____________ Sarah Mueller time: ____________ Date: ____________ Movements: ____________ Start time: ____________ Sarah Mueller time: ____________ Date: ____________ Movements: ____________ Start time: ____________ Sarah Mueller time: ____________ Date: ____________ Movements: ____________ Start time: ____________ Sarah Mueller time: ____________ Date: ____________ Movements: ____________ Start time: ____________ Sarah Mueller  time: ____________ Date: ____________ Movements: ____________ Start time: ____________ Sarah Mueller time: ____________ Date: ____________ Movements: ____________ Start time: ____________ Sarah Mueller time: ____________  Date: ____________ Movements: ____________ Start time: ____________ Sarah Mueller time: ____________ Date: ____________ Movements: ____________ Start time: ____________ Sarah Mueller time: ____________ Date: ____________ Movements: ____________ Start time: ____________ Sarah Mueller time: ____________ Date: ____________ Movements: ____________ Start time: ____________ Sarah Mueller time: ____________ Date: ____________ Movements: ____________ Start time: ____________ Sarah Mueller time: ____________ Date: ____________ Movements: ____________ Start time: ____________ Sarah Mueller time: ____________   This information is not intended to replace advice given to you by your health care provider. Make sure you discuss any questions you have with your health care provider.   Document Released: 11/19/2006 Document Revised: 11/10/2014 Document Reviewed: 08/16/2012 Elsevier Interactive Patient Education [employer].

## 2016-04-30 ENCOUNTER — Encounter (HOSPITAL_COMMUNITY): Payer: Self-pay

## 2016-05-02 ENCOUNTER — Encounter (HOSPITAL_COMMUNITY)
Admission: RE | Admit: 2016-05-02 | Discharge: 2016-05-02 | Disposition: A | Discharge status: Home / Self Care | Payer: Medicaid Other | Source: Ambulatory Visit | Attending: Family Medicine | Admitting: Family Medicine

## 2016-05-02 ENCOUNTER — Telehealth: Payer: Self-pay | Admitting: Obstetrics & Gynecology

## 2016-05-02 LAB — CBC
HEMATOCRIT: 36.6 % (ref 36.0–46.0)
HEMOGLOBIN: 12.4 g/dL (ref 12.0–15.0)
MCH: 31.4 pg (ref 26.0–34.0)
MCHC: 33.9 g/dL (ref 30.0–36.0)
MCV: 92.7 fL (ref 78.0–100.0)
PLATELETS: 266 10*3/uL (ref 150–400)
RBC: 3.95 MIL/uL (ref 3.87–5.11)
RDW: 14 % (ref 11.5–15.5)
WBC: 10.4 10*3/uL (ref 4.0–10.5)

## 2016-05-02 NOTE — Telephone Encounter (Signed)
[facility] OB/GYN Attending Phone Call Documentation  I placed a call to Sarah Mueller letting her know of the time change for her scheduled cesarean section on 05/03/16 from 1115 to 1215.  Also told her to come in between 1030 -1100, NPO after midnight.  Patient verbalized understanding of plan.   Verita Schneiders, MD, [place] Attending Obstetrician & Gynecologist, [place], [place] for [employer]

## 2016-05-02 NOTE — Patient Instructions (Signed)
20 Sarah Mueller  05/02/2016   Your procedure is scheduled on:  05/03/2016  Enter through the [facility] the person at the desk you are here for a Cesarean Section.   Call this number if you have problems the morning of surgery: [phone]   Remember:   Do not eat food:After Midnight.  Do not drink clear liquids: After Midnight.  Take these medicines the morning of surgery with A SIP OF WATER: none   Do not wear jewelry, make-up or nail polish.  Do not wear lotions, powders, or perfumes. You may wear deodorant.  Do not shave 48 hours prior to surgery.  Do not bring valuables to the hospital.  [facility] is not   responsible for any belongings or valuables brought to the hospital.  Contacts, dentures or bridgework may not be worn into surgery.  Leave suitcase in the car. After surgery it may be brought to your room.  For patients admitted to the hospital, checkout time is 11:00 AM the day of              discharge.   Patients discharged the day of surgery will not be allowed to drive             home.  Name and phone number of your driver: na  Special Instructions:   N/A   Please read over the following fact sheets that you were given:   Surgical Site Infection Prevention

## 2016-05-03 ENCOUNTER — Inpatient Hospital Stay (HOSPITAL_COMMUNITY): Payer: Medicaid Other | Admitting: Anesthesiology

## 2016-05-03 ENCOUNTER — Inpatient Hospital Stay (HOSPITAL_COMMUNITY)
Admission: AD | Admit: 2016-05-03 | Discharge: 2016-05-05 | DRG: 765 | Disposition: A | Discharge status: Home / Self Care | Payer: Medicaid Other | Source: Ambulatory Visit | Attending: Family Medicine | Admitting: Family Medicine

## 2016-05-03 ENCOUNTER — Encounter (HOSPITAL_COMMUNITY)
Admission: AD | Disposition: A | Discharge status: Home / Self Care | Payer: Self-pay | Source: Ambulatory Visit | Attending: Family Medicine

## 2016-05-03 ENCOUNTER — Encounter (HOSPITAL_COMMUNITY): Payer: Self-pay | Admitting: Family Medicine

## 2016-05-03 ENCOUNTER — Encounter (HOSPITAL_COMMUNITY): Payer: Self-pay | Admitting: Anesthesiology

## 2016-05-03 DIAGNOSIS — Z302 Encounter for sterilization: Secondary | ICD-10-CM

## 2016-05-03 DIAGNOSIS — Z823 Family history of stroke: Secondary | ICD-10-CM | POA: Diagnosis not present

## 2016-05-03 DIAGNOSIS — O99214 Obesity complicating childbirth: Secondary | ICD-10-CM | POA: Diagnosis present

## 2016-05-03 DIAGNOSIS — Z9049 Acquired absence of other specified parts of digestive tract: Secondary | ICD-10-CM | POA: Diagnosis not present

## 2016-05-03 DIAGNOSIS — O99213 Obesity complicating pregnancy, third trimester: Secondary | ICD-10-CM

## 2016-05-03 DIAGNOSIS — Z8249 Family history of ischemic heart disease and other diseases of the circulatory system: Secondary | ICD-10-CM

## 2016-05-03 DIAGNOSIS — O34211 Maternal care for low transverse scar from previous cesarean delivery: Principal | ICD-10-CM | POA: Diagnosis present

## 2016-05-03 DIAGNOSIS — Z87891 Personal history of nicotine dependence: Secondary | ICD-10-CM | POA: Diagnosis not present

## 2016-05-03 DIAGNOSIS — Z79899 Other long term (current) drug therapy: Secondary | ICD-10-CM | POA: Diagnosis not present

## 2016-05-03 DIAGNOSIS — Z6841 Body Mass Index (BMI) 40.0 and over, adult: Secondary | ICD-10-CM

## 2016-05-03 DIAGNOSIS — Z9889 Other specified postprocedural states: Secondary | ICD-10-CM

## 2016-05-03 DIAGNOSIS — Z3A39 39 weeks gestation of pregnancy: Secondary | ICD-10-CM | POA: Diagnosis present

## 2016-05-03 DIAGNOSIS — O34219 Maternal care for unspecified type scar from previous cesarean delivery: Secondary | ICD-10-CM | POA: Diagnosis present

## 2016-05-03 DIAGNOSIS — E669 Obesity, unspecified: Secondary | ICD-10-CM | POA: Diagnosis not present

## 2016-05-03 DIAGNOSIS — Z98891 History of uterine scar from previous surgery: Secondary | ICD-10-CM

## 2016-05-03 LAB — RPR: RPR Ser Ql: NONREACTIVE

## 2016-05-03 LAB — PREPARE RBC (CROSSMATCH)

## 2016-05-03 SURGERY — Surgical Case
Anesthesia: Spinal | Site: Abdomen

## 2016-05-03 MED ORDER — PHENYLEPHRINE 8 MG IN D5W 100 ML (0.08MG/ML) PREMIX OPTIME
INJECTION | INTRAVENOUS | Status: DC | PRN
  Administered 2016-05-03: 60 ug/min via INTRAVENOUS

## 2016-05-03 MED ORDER — NALBUPHINE HCL 10 MG/ML IJ SOLN: 5.0000 mg | INTRAMUSCULAR | Status: DC | PRN

## 2016-05-03 MED ORDER — TETANUS-DIPHTH-ACELL PERTUSSIS 5-2.5-18.5 LF-MCG/0.5 IM SUSP: 0.5000 mL | Freq: Once | INTRAMUSCULAR | Status: DC

## 2016-05-03 MED ORDER — OXYTOCIN 40 UNITS IN LACTATED RINGERS INFUSION - SIMPLE MED: 2.5000 [IU]/h | INTRAVENOUS | Status: AC

## 2016-05-03 MED ORDER — OXYTOCIN 10 UNIT/ML IJ SOLN
40.0000 [IU] | INTRAVENOUS | Status: DC | PRN
  Administered 2016-05-03: 40 [IU] via INTRAVENOUS

## 2016-05-03 MED ORDER — DIPHENHYDRAMINE HCL 25 MG PO CAPS
25.0000 mg | ORAL_CAPSULE | ORAL | Status: DC | PRN
Start: 2016-05-03 — End: 2016-05-05
  Administered 2016-05-03: 25 mg via ORAL
  Filled 2016-05-03 (×2): qty 1

## 2016-05-03 MED ORDER — NALBUPHINE HCL 10 MG/ML IJ SOLN
5.0000 mg | INTRAMUSCULAR | Status: DC | PRN
  Administered 2016-05-03: 5 mg via INTRAVENOUS
  Filled 2016-05-03: qty 1

## 2016-05-03 MED ORDER — OXYTOCIN 10 UNIT/ML IJ SOLN
INTRAMUSCULAR | Status: AC
  Filled 2016-05-03: qty 4

## 2016-05-03 MED ORDER — LACTATED RINGERS IV SOLN: 125.0000 mL/h | INTRAVENOUS | Status: DC

## 2016-05-03 MED ORDER — ONDANSETRON HCL 4 MG/2ML IJ SOLN
INTRAMUSCULAR | Status: AC
  Filled 2016-05-03: qty 2

## 2016-05-03 MED ORDER — NALBUPHINE HCL 10 MG/ML IJ SOLN: 5.0000 mg | Freq: Once | INTRAMUSCULAR | Status: DC | PRN

## 2016-05-03 MED ORDER — LACTATED RINGERS IV SOLN
125.0000 mL/h | INTRAVENOUS | Status: DC
  Administered 2016-05-03: 125 mL/h via INTRAVENOUS
  Administered 2016-05-03 (×2): via INTRAVENOUS

## 2016-05-03 MED ORDER — FENTANYL CITRATE (PF) 100 MCG/2ML IJ SOLN
INTRAMUSCULAR | Status: AC
  Filled 2016-05-03: qty 2

## 2016-05-03 MED ORDER — OXYCODONE HCL 5 MG PO TABS: 5.0000 mg | ORAL_TABLET | ORAL | Status: DC | PRN

## 2016-05-03 MED ORDER — SIMETHICONE 80 MG PO CHEW: 80.0000 mg | CHEWABLE_TABLET | ORAL | Status: DC | PRN

## 2016-05-03 MED ORDER — SCOPOLAMINE 1 MG/3DAYS TD PT72: 1.0000 | MEDICATED_PATCH | Freq: Once | TRANSDERMAL | Status: DC

## 2016-05-03 MED ORDER — DIBUCAINE 1 % RE OINT: 1.0000 "application " | TOPICAL_OINTMENT | RECTAL | Status: DC | PRN

## 2016-05-03 MED ORDER — SIMETHICONE 80 MG PO CHEW
80.0000 mg | CHEWABLE_TABLET | Freq: Three times a day (TID) | ORAL | Status: DC
  Administered 2016-05-03 – 2016-05-05 (×4): 80 mg via ORAL
  Filled 2016-05-03 (×5): qty 1

## 2016-05-03 MED ORDER — ZOLPIDEM TARTRATE 5 MG PO TABS: 5.0000 mg | ORAL_TABLET | Freq: Every evening | ORAL | Status: DC | PRN

## 2016-05-03 MED ORDER — KETOROLAC TROMETHAMINE 30 MG/ML IJ SOLN
30.0000 mg | Freq: Four times a day (QID) | INTRAMUSCULAR | Status: AC | PRN
Start: 2016-05-03 — End: 2016-05-04

## 2016-05-03 MED ORDER — FENTANYL CITRATE (PF) 100 MCG/2ML IJ SOLN
INTRAMUSCULAR | Status: DC | PRN
  Administered 2016-05-03: 10 ug via INTRATHECAL

## 2016-05-03 MED ORDER — SODIUM CHLORIDE 0.9% FLUSH: 3.0000 mL | INTRAVENOUS | Status: DC | PRN

## 2016-05-03 MED ORDER — DIPHENHYDRAMINE HCL 50 MG/ML IJ SOLN
INTRAMUSCULAR | Status: AC
  Filled 2016-05-03: qty 1

## 2016-05-03 MED ORDER — CEFAZOLIN SODIUM-DEXTROSE 2-4 GM/100ML-% IV SOLN: 2.0000 g | INTRAVENOUS | Status: DC

## 2016-05-03 MED ORDER — DIPHENHYDRAMINE HCL 25 MG PO CAPS: 25.0000 mg | ORAL_CAPSULE | Freq: Four times a day (QID) | ORAL | Status: DC | PRN

## 2016-05-03 MED ORDER — ACETAMINOPHEN 325 MG PO TABS
650.0000 mg | ORAL_TABLET | ORAL | Status: DC | PRN
  Administered 2016-05-03: 650 mg via ORAL
  Filled 2016-05-03: qty 2

## 2016-05-03 MED ORDER — KETOROLAC TROMETHAMINE 30 MG/ML IJ SOLN
30.0000 mg | Freq: Four times a day (QID) | INTRAMUSCULAR | Status: AC | PRN
Start: 2016-05-03 — End: 2016-05-04
  Administered 2016-05-03: 30 mg via INTRAMUSCULAR

## 2016-05-03 MED ORDER — ERYTHROMYCIN 5 MG/GM OP OINT
TOPICAL_OINTMENT | OPHTHALMIC | Status: AC
Start: 2016-05-03 — End: 2016-05-04
  Filled 2016-05-03: qty 1

## 2016-05-03 MED ORDER — NALOXONE HCL 0.4 MG/ML IJ SOLN: 0.4000 mg | INTRAMUSCULAR | Status: DC | PRN

## 2016-05-03 MED ORDER — IBUPROFEN 600 MG PO TABS
600.0000 mg | ORAL_TABLET | Freq: Four times a day (QID) | ORAL | Status: DC
  Administered 2016-05-03 – 2016-05-05 (×5): 600 mg via ORAL
  Filled 2016-05-03 (×6): qty 1

## 2016-05-03 MED ORDER — LACTATED RINGERS IV SOLN
INTRAVENOUS | Status: DC
  Administered 2016-05-03: 23:00:00 via INTRAVENOUS

## 2016-05-03 MED ORDER — PHENYLEPHRINE 8 MG IN D5W 100 ML (0.08MG/ML) PREMIX OPTIME
INJECTION | INTRAVENOUS | Status: AC
  Filled 2016-05-03: qty 100

## 2016-05-03 MED ORDER — MENTHOL 3 MG MT LOZG
1.0000 | LOZENGE | OROMUCOSAL | Status: DC | PRN
Start: 2016-05-03 — End: 2016-05-05

## 2016-05-03 MED ORDER — MORPHINE SULFATE (PF) 0.5 MG/ML IJ SOLN
INTRAMUSCULAR | Status: DC | PRN
  Administered 2016-05-03: .2 mg via INTRATHECAL

## 2016-05-03 MED ORDER — WITCH HAZEL-GLYCERIN EX PADS: 1.0000 "application " | MEDICATED_PAD | CUTANEOUS | Status: DC | PRN

## 2016-05-03 MED ORDER — SOD CITRATE-CITRIC ACID 500-334 MG/5ML PO SOLN
ORAL | Status: AC
  Administered 2016-05-03: 30 mL
  Filled 2016-05-03: qty 15

## 2016-05-03 MED ORDER — LACTATED RINGERS IV SOLN
INTRAVENOUS | Status: DC | PRN
  Administered 2016-05-03: 12:00:00 via INTRAVENOUS

## 2016-05-03 MED ORDER — COCONUT OIL OIL: 1.0000 "application " | TOPICAL_OIL | Status: DC | PRN

## 2016-05-03 MED ORDER — CEFAZOLIN SODIUM-DEXTROSE 2-4 GM/100ML-% IV SOLN
2.0000 g | INTRAVENOUS | Status: AC
Start: 2016-05-03 — End: 2016-05-03
  Administered 2016-05-03: 2 g via INTRAVENOUS
  Filled 2016-05-03: qty 100

## 2016-05-03 MED ORDER — SIMETHICONE 80 MG PO CHEW
80.0000 mg | CHEWABLE_TABLET | ORAL | Status: DC
  Administered 2016-05-03 – 2016-05-04 (×2): 80 mg via ORAL
  Filled 2016-05-03 (×2): qty 1

## 2016-05-03 MED ORDER — KETOROLAC TROMETHAMINE 30 MG/ML IJ SOLN
INTRAMUSCULAR | Status: AC
  Filled 2016-05-03: qty 1

## 2016-05-03 MED ORDER — OXYCODONE HCL 5 MG PO TABS
10.0000 mg | ORAL_TABLET | ORAL | Status: DC | PRN
  Administered 2016-05-04 – 2016-05-05 (×5): 10 mg via ORAL
  Filled 2016-05-03 (×5): qty 2

## 2016-05-03 MED ORDER — MORPHINE SULFATE (PF) 0.5 MG/ML IJ SOLN
INTRAMUSCULAR | Status: AC
  Filled 2016-05-03: qty 10

## 2016-05-03 MED ORDER — ONDANSETRON HCL 4 MG/2ML IJ SOLN
4.0000 mg | Freq: Three times a day (TID) | INTRAMUSCULAR | Status: DC | PRN
Start: 2016-05-03 — End: 2016-05-05

## 2016-05-03 MED ORDER — PRENATAL MULTIVITAMIN CH
1.0000 | ORAL_TABLET | Freq: Every day | ORAL | Status: DC
  Administered 2016-05-04: 1 via ORAL
  Filled 2016-05-03: qty 1

## 2016-05-03 MED ORDER — NALOXONE HCL 2 MG/2ML IJ SOSY
1.0000 ug/kg/h | PREFILLED_SYRINGE | INTRAVENOUS | Status: DC | PRN
  Filled 2016-05-03: qty 2

## 2016-05-03 MED ORDER — SENNOSIDES-DOCUSATE SODIUM 8.6-50 MG PO TABS
2.0000 | ORAL_TABLET | ORAL | Status: DC
  Administered 2016-05-03 – 2016-05-04 (×2): 2 via ORAL
  Filled 2016-05-03 (×2): qty 2

## 2016-05-03 MED ORDER — DIPHENHYDRAMINE HCL 50 MG/ML IJ SOLN
12.5000 mg | INTRAMUSCULAR | Status: DC | PRN
  Administered 2016-05-03: 12.5 mg via INTRAVENOUS

## 2016-05-03 MED ORDER — MEPERIDINE HCL 25 MG/ML IJ SOLN: 6.2500 mg | INTRAMUSCULAR | Status: DC | PRN

## 2016-05-03 MED ORDER — BUPIVACAINE IN DEXTROSE 0.75-8.25 % IT SOLN
INTRATHECAL | Status: DC | PRN
  Administered 2016-05-03: 1.4 mL via INTRATHECAL

## 2016-05-03 SURGICAL SUPPLY — 39 items
APL SKNCLS STERI-STRIP NONHPOA (GAUZE/BANDAGES/DRESSINGS) ×1
BENZOIN TINCTURE PRP APPL 2/3 (GAUZE/BANDAGES/DRESSINGS) ×3 IMPLANT
CATH ROBINSON RED A/P 16FR (CATHETERS) IMPLANT
CLAMP CORD UMBIL (MISCELLANEOUS) IMPLANT
CLIP FILSHIE TUBAL LIGA STRL (Clip) ×4 IMPLANT
CLOSURE WOUND 1/2 X4 (GAUZE/BANDAGES/DRESSINGS) ×1
CLOTH BEACON ORANGE TIMEOUT ST (SAFETY) ×3 IMPLANT
DRSG OPSITE POSTOP 4X10 (GAUZE/BANDAGES/DRESSINGS) ×3 IMPLANT
DURAPREP 26ML APPLICATOR (WOUND CARE) ×3 IMPLANT
ELECT REM PT RETURN 9FT ADLT (ELECTROSURGICAL) ×3
ELECTRODE REM PT RTRN 9FT ADLT (ELECTROSURGICAL) ×1 IMPLANT
EXTRACTOR VACUUM M CUP 4 TUBE (SUCTIONS) IMPLANT
EXTRACTOR VACUUM M CUP 4' TUBE (SUCTIONS)
GLOVE BIOGEL PI IND STRL 7.0 (GLOVE) ×1 IMPLANT
GLOVE BIOGEL PI IND STRL 7.5 (GLOVE) ×2 IMPLANT
GLOVE BIOGEL PI INDICATOR 7.0 (GLOVE) ×2
GLOVE BIOGEL PI INDICATOR 7.5 (GLOVE) ×4
GLOVE ECLIPSE 7.5 STRL STRAW (GLOVE) ×3 IMPLANT
GOWN STRL REUS W/TWL LRG LVL3 (GOWN DISPOSABLE) ×9 IMPLANT
KIT ABG SYR 3ML LUER SLIP (SYRINGE) IMPLANT
NDL HYPO 25X5/8 SAFETYGLIDE (NEEDLE) IMPLANT
NEEDLE HYPO 25X5/8 SAFETYGLIDE (NEEDLE) IMPLANT
NS IRRIG 1000ML POUR BTL (IV SOLUTION) ×3 IMPLANT
PACK C SECTION WH (CUSTOM PROCEDURE TRAY) ×3 IMPLANT
PAD ABD 8X10 STRL (GAUZE/BANDAGES/DRESSINGS) ×2 IMPLANT
PAD OB MATERNITY 4.3X12.25 (PERSONAL CARE ITEMS) ×3 IMPLANT
PENCIL SMOKE EVAC W/HOLSTER (ELECTROSURGICAL) ×3 IMPLANT
RTRCTR C-SECT PINK 25CM LRG (MISCELLANEOUS) ×3 IMPLANT
SPONGE GAUZE 4X4 16PLY NONSTR (GAUZE/BANDAGES/DRESSINGS) ×2 IMPLANT
STRIP CLOSURE SKIN 1/2X4 (GAUZE/BANDAGES/DRESSINGS) ×2 IMPLANT
SUT MNCRL 0 VIOLET CTX 36 (SUTURE) IMPLANT
SUT MONOCRYL 0 CTX 36 (SUTURE)
SUT VIC AB 0 CTX 36 (SUTURE) ×9
SUT VIC AB 0 CTX36XBRD ANBCTRL (SUTURE) ×3 IMPLANT
SUT VIC AB 2-0 CT1 27 (SUTURE) ×3
SUT VIC AB 2-0 CT1 TAPERPNT 27 (SUTURE) ×1 IMPLANT
SUT VIC AB 4-0 KS 27 (SUTURE) ×3 IMPLANT
TOWEL OR 17X24 6PK STRL BLUE (TOWEL DISPOSABLE) ×3 IMPLANT
TRAY FOLEY CATH SILVER 14FR (SET/KITS/TRAYS/PACK) ×3 IMPLANT

## 2016-05-03 NOTE — Transfer of Care (Signed)
Immediate Anesthesia Transfer of Care Note  Patient: Sarah Mueller  Procedure(s) Performed: Procedure(s): CESAREAN SECTION (N/A)  Patient Location: PACU  Anesthesia Type:Spinal  Level of Consciousness: awake and alert   Airway & Oxygen Therapy: Patient Spontanous Breathing  Post-op Assessment: Report given to RN and Post -op Vital signs reviewed and stable  Post vital signs: Reviewed and stable  Last Vitals:  Filed Vitals:   05/03/16 1124  BP: 126/84  Pulse: 82  Temp: 36.8 C  Resp: 18    Last Pain: There were no vitals filed for this visit.       Complications: No apparent anesthesia complications

## 2016-05-03 NOTE — Anesthesia Preprocedure Evaluation (Addendum)
Anesthesia Evaluation  Patient identified by MRN, date of birth, ID band Patient awake    Reviewed: Allergy & Precautions, NPO status , Patient's Chart, lab work & pertinent test results  Airway Mallampati: II  TM Distance: >3 FB Neck ROM: Full    Dental   Pulmonary former smoker,    breath sounds clear to auscultation       Cardiovascular negative cardio ROS   Rhythm:Regular Rate:Normal     Neuro/Psych negative neurological ROS     GI/Hepatic negative GI ROS, Neg liver ROS,   Endo/Other  Morbid obesity  Renal/GU negative Renal ROS     Musculoskeletal   Abdominal   Peds  Hematology negative hematology ROS (+)   Anesthesia Other Findings   Reproductive/Obstetrics (+) Pregnancy (Hx of uterine fibroids and previous c-section for repeat.)                            Lab Results  Component Value Date   WBC 10.4 05/02/2016   HGB 12.4 05/02/2016   HCT 36.6 05/02/2016   MCV 92.7 05/02/2016   PLT 266 05/02/2016    Anesthesia Physical Anesthesia Plan  ASA: III  Anesthesia Plan: Spinal   Post-op Pain Management:    Induction:   Airway Management Planned: Natural Airway  Additional Equipment:   Intra-op Plan:   Post-operative Plan:   Informed Consent: I have reviewed the patients History and Physical, chart, labs and discussed the procedure including the risks, benefits and alternatives for the proposed anesthesia with the patient or authorized representative who has indicated his/her understanding and acceptance.     Plan Discussed with: CRNA  Anesthesia Plan Comments:         Anesthesia Quick Evaluation

## 2016-05-03 NOTE — Anesthesia Procedure Notes (Signed)
Spinal  Staffing Anesthesiologist: Suzette Battiest Performed by: anesthesiologist  Preanesthetic Checklist Completed: patient identified, site marked, surgical consent, pre-op evaluation, timeout performed, IV checked, risks and benefits discussed and monitors and equipment checked Spinal Block Patient position: sitting Prep: site prepped and draped and DuraPrep Patient monitoring: continuous pulse ox, blood pressure and heart rate Approach: midline Location: L4-5 Injection technique: single-shot Needle Needle type: Pencan  Needle gauge: 24 G Needle length: 9 cm Assessment Sensory level: T6

## 2016-05-03 NOTE — H&P (Signed)
[facility] H&P  Sarah Mueller is a 40 y.o. female G64P1011 with IUP at [redacted]w[redacted]d presenting for elective repeat cesarean section. Pregnancy was been complicated by obesity, AMA, previous cesarean section.    Pt states she has been having no contractions, no vaginal bleeding, intact membranes, with normal fetal movement.     Prenatal Course Source of Care: [facility]  with onset of care at [id number] Pregnancy complications or risks: Patient Active Problem List   Diagnosis Date Noted  . Uterine fibroid complicating antenatal care, baby not yet delivered 04/22/2016  . Maternal obesity, antepartum 04/01/2016  . Supervision of normal pregnancy, antepartum 02/18/2016  . Previous cesarean delivery, antepartum condition or complication [id number]  . Antepartum multigravida of advanced maternal age 78/17/2017  . History of acute pancreatitis 03/30/2013  . Obesity, unspecified 03/30/2013  . Tobacco abuse 03/30/2013   She desires to bilateral tubal ligation.  She plans to plans to breastfeed  Prenatal labs and studies: ABO, Rh: --/--/O POS (06/30 [id number]) Antibody: NEG (06/30 0955) Rubella: !Error! RPR: Non Reactive (06/30 0955)  HBsAg: Negative (03/20 1130)  HIV: Non Reactive (03/20 1130)  GBS: Negative (06/13 0000)  1 hr Glucola 116 Genetic screeningdeclined Anatomy US normal  Past Medical History:  Past Medical History  Diagnosis Date  . Pancreatitis 03/30/2013  . Infection     UTI  . Depression     fine now  . Chlamydia     Past Surgical History:  Past Surgical History  Procedure Laterality Date  . Cesarean section  2005  . Dilation and curettage of uterus  ~ 2000  . Cholecystectomy N/A 03/31/2013    Procedure: LAPAROSCOPIC CHOLECYSTECTOMY WITH INTRAOPERATIVE CHOLANGIOGRAM;  Surgeon: Ralene Ok, MD;  Location: [facility];  Service: General;  Laterality: N/A;  . Induced abortion      Obstetrical History:  OB History    Gravida Para Term Preterm AB TAB SAB Ectopic Multiple  Living   3 1 1  1 1  0  0 1      Gynecological History:  OB History    Gravida Para Term Preterm AB TAB SAB Ectopic Multiple Living   3 1 1  1 1  0  0 1      Social History:  Social History   Social History  . Marital Status: Single    Spouse Name: N/A  . Number of Children: N/A  . Years of Education: N/A   Social History Main Topics  . Smoking status: Former Smoker -- 0.33 packs/day for 15 years    Types: Cigarettes  . Smokeless tobacco: Never Used     Comment: quit early Jan  . Alcohol Use: No  . Drug Use: No  . Sexual Activity: Yes    Birth Control/ Protection: None   Other Topics Concern  . Not on file   Social History Narrative    Family History:  Family History  Problem Relation Age of Onset  . Hypertension Mother   . Stroke Maternal Grandmother   . Stroke Maternal Grandfather   . Stroke Paternal Grandmother   . Stroke Paternal Grandfather   . Hypertension Paternal Grandfather     Medications:  Prenatal vitamins,  Current Facility-Administered Medications  Medication Dose Route Frequency Provider Last Rate Last Dose  . ceFAZolin (ANCEF) IVPB 2g/100 mL premix  2 g Intravenous On Call to [place], DO      . ceFAZolin (ANCEF) IVPB 2g/100 mL premix  2 g Intravenous On Call to [place]  J Stinson, DO      . lactated ringers infusion  125 mL/hr Intravenous Continuous Tanna Savoy Stinson, DO      . lactated ringers infusion  125 mL/hr Intravenous Continuous Truett Mainland, DO        Allergies: No Known Allergies  Review of Systems: - negative  Physical Exam: Last menstrual period 08/04/2015. GENERAL: Well-developed, well-nourished female in no acute distress.  LUNGS: Clear to auscultation bilaterally.  HEART: Regular rate and rhythm. ABDOMEN: Soft, nontender, nondistended, gravid. EFW 8 lbs EXTREMITIES: Nontender, no edema, 2+ distal pulses.      Pertinent Labs/Studies:   Lab Results  Component Value Date   WBC 10.4 05/02/2016   HGB 12.4  05/02/2016   HCT 36.6 05/02/2016   MCV 92.7 05/02/2016   PLT 266 05/02/2016     Assessment : Sarah Mueller is a 40 y.o. G3P1011 at [redacted]w[redacted]d being admitted for cesarean section secondary to elective repeat  Plan: The risks of cesarean section discussed with the patient included but were not limited to: bleeding which may require transfusion or reoperation; infection which may require antibiotics; injury to bowel, bladder, ureters or other surrounding organs; injury to the fetus; need for additional procedures including hysterectomy in the event of a life-threatening hemorrhage; placental abnormalities wth subsequent pregnancies, incisional problems, thromboembolic phenomenon and other postoperative/anesthesia complications. The patient concurred with the proposed plan, giving informed written consent for the procedure.   Patient has been NPO since last night and will remain NPO for procedure.  Preoperative prophylactic Ancef ordered on call to the OR.    Truett Mainland, DO 05/03/2016, 11:20 AM

## 2016-05-03 NOTE — Anesthesia Postprocedure Evaluation (Signed)
Anesthesia Post Note  Patient: Sarah Mueller  Procedure(s) Performed: Procedure(s) (LRB): CESAREAN SECTION (N/A)  Patient location during evaluation: PACU Anesthesia Type: Spinal and MAC Level of consciousness: awake and alert Pain management: pain level controlled Vital Signs Assessment: post-procedure vital signs reviewed and stable Respiratory status: spontaneous breathing and respiratory function stable Cardiovascular status: blood pressure returned to baseline and stable Postop Assessment: spinal receding Anesthetic complications: no     Last Vitals:  Filed Vitals:   05/03/16 1448 05/03/16 1503  BP:  121/86  Pulse: 54 57  Temp:  36.4 C  Resp: 16 18    Last Pain:  Filed Vitals:   05/03/16 1520  PainSc: 2    Pain Goal:                 [name]

## 2016-05-03 NOTE — Op Note (Signed)
Cesarean Section Operative Report  Sarah Mueller  05/03/2016  Indications: Scheduled Proceedure/Maternal Request   Pre-operative Diagnosis: REPEAT CS with bilateral tubal ligation.   Post-operative Diagnosis: Same   Surgeon: Surgeon(s) and Role:    * Truett Mainland, DO - Primary    * Gwynne Edinger, MD - Fellow   Attending Attestation: I was present and scrubbed for the entire procedure.   Assistants: none  Anesthesia: spinal    Estimated Blood Loss: 500 ml  Total IV Fluids: 2700 ml LR  Urine Output:: 50 ml clear yellow urine  Specimens: none  Findings: Viable female infant in cephalic presentation; Apgars 10 at the first minute, 5-minute score not currently available; weight 3135 g; arterial cord pH not obtained; clear amniotic fluid; intact placenta with three vessel cord; normal uterus, fallopian tubes and ovaries bilaterally. No significant adhesive disease encountered.  Baby condition / location:  Couplet care / Skin to Skin   Complications: no complications  Indications: [name] is a 40 y.o. [id number] with an IUP [redacted]w[redacted]d presenting for elective repeat c/s with bilateral tubal ligation.  The risks, benefits, complications, treatment options, and exected outcomes were discussed with the patient . The patient dwith the proposed plan, giving informed consent. identified as Sarah Mueller and the procedure verified as C-Section Delivery.  Procedure Details:  The patient was taken back to the operative suite where spinal anesthesia was placed.  A time out was held and the above information confirmed.   After induction of anesthesia, the patient was draped and prepped in the usual sterile manner and placed in a dorsal supine position with a leftward tilt. A Pfannenstiel incision was made and carried down through the subcutaneous tissue to the fascia. Fascial incision was made and sharply extended transversely. The fascia was separated from the underlying  rectus tissue superiorly and inferiorly. The peritoneum was identified and bluntly entered and extended longitudinally. Alexis retractor was placed. A low transverse uterine incision was made and extended bluntly. Delivered from cephalic presentation was a viable infant with Apgars and weight as above.  After waiting 60 seconds for delayed cord cutting, the umbilical cord was clamped and cut cord blood was obtained for evaluation. Cord ph was not sent. The placenta was removed Intact and appeared normal. The uterine outline, tubes and ovaries appeared normal. The uterine incision was closed with running locked sutures of 0Vicryl in one layer.  Hemostasis was observed. The Fallopian tubes were identified bilaterally.  A Filshie clip was placed on each tube without difficulty 3 cm from the cornua.  There was no bleeding. The peritoneum was closed with 0 vicryl. The rectus muscles were examined and hemostasis observed. The fascia was then reapproximated with running sutures of 0Vicryl. The skin was closed with 4-0Vicryl.   Instrument, sponge, and needle counts were correct prior the abdominal closure and were correct at the conclusion of the case.     Disposition: PACU - hemodynamically stable.   Maternal Condition: stable       Signed: Ennis Forts 05/03/2016 12:58 PM

## 2016-05-03 NOTE — Lactation Note (Signed)
This note was copied from a baby's chart. Lactation Consultation Note  Patient Name: Girl Willisha Morvay [id number] Date: 05/03/2016 Reason for consult: Initial assessment Baby at [facility] of life and mom has switched to formula. Discussed breast changes and lactation services if she needs them.   Maternal Data    Feeding Feeding Type: Bottle Fed - Formula Nipple Type: Slow - flow  LATCH Score/Interventions                      Lactation Tools Discussed/Used     Consult Status Consult Status: Complete    Denzil Hughes 05/03/2016, 5:57 PM

## 2016-05-04 ENCOUNTER — Encounter (HOSPITAL_COMMUNITY): Payer: Self-pay | Admitting: Pediatrics

## 2016-05-04 LAB — CBC
HEMATOCRIT: 31.6 % — AB (ref 36.0–46.0)
HEMOGLOBIN: 10.9 g/dL — AB (ref 12.0–15.0)
MCH: 32.2 pg (ref 26.0–34.0)
MCHC: 34.5 g/dL (ref 30.0–36.0)
MCV: 93.5 fL (ref 78.0–100.0)
Platelets: 207 10*3/uL (ref 150–400)
RBC: 3.38 MIL/uL — ABNORMAL LOW (ref 3.87–5.11)
RDW: 14 % (ref 11.5–15.5)
WBC: 9.8 10*3/uL (ref 4.0–10.5)

## 2016-05-04 NOTE — Progress Notes (Signed)
Post OP Day 1 Subjective: Notes of pain not controlled with Tylenol and Ibuprofen. No flatus or BM yet. Still has foley cath in. Tolerating PO intake.    Objective: Blood pressure 115/77, pulse 69, temperature 98 F (36.7 C), temperature source Oral, resp. rate 18, last menstrual period 08/04/2015, SpO2 97 %, unknown if currently breastfeeding.  Physical Exam:  General: alert and no distress Lochia: appropriate Abdomen: + BS Uterine Fundus: firm Incision: bandage is clean and dry  DVT Evaluation: No evidence of DVT seen on physical exam.   Recent Labs  05/02/16 0955 05/04/16 0512  HGB 12.4 10.9*  HCT 36.6 31.6*    Assessment/Plan: POD#1 continue PP care  Likely dc foley today  Pain: Oxy IR ordered already as PRN Ambulate to promote flatus and BM    LOS: 1 day   Smiley Houseman 05/04/2016, 9:03 AM

## 2016-05-04 NOTE — Anesthesia Postprocedure Evaluation (Signed)
Anesthesia Post Note  Patient: Sarah Mueller  Procedure(s) Performed: Procedure(s) (LRB): CESAREAN SECTION (N/A)  Patient location during evaluation: Mother Baby Anesthesia Type: Spinal Level of consciousness: awake and alert and oriented Pain management: satisfactory to patient Vital Signs Assessment: post-procedure vital signs reviewed and stable Respiratory status: respiratory function stable and spontaneous breathing Cardiovascular status: blood pressure returned to baseline Postop Assessment: no headache, no backache, spinal receding, patient able to bend at knees and adequate PO intake Anesthetic complications: no     Last Vitals:  Filed Vitals:   05/03/16 2300 05/04/16 0505  BP:  115/77  Pulse:  69  Temp: 36.6 C 36.7 C  Resp: 18 18    Last Pain:  Filed Vitals:   05/04/16 0829  PainSc: 0-No pain   Pain Goal:                 Carlton Buskey

## 2016-05-04 NOTE — Addendum Note (Signed)
Addendum  created 05/04/16 0843 by Flossie Dibble, CRNA   Modules edited: Notes Section   Notes Section:  File: [id number]

## 2016-05-04 NOTE — Progress Notes (Addendum)
Upon assessment, dressing of patient was nearly saturated. Called resident Keitha Butte and was given verbal orders with readback to change the dressing.

## 2016-05-05 MED ORDER — OXYCODONE HCL 10 MG PO TABS: 10.0000 mg | ORAL_TABLET | ORAL | Status: DC | PRN

## 2016-05-05 MED ORDER — IBUPROFEN 600 MG PO TABS: 600.0000 mg | ORAL_TABLET | Freq: Four times a day (QID) | ORAL | Status: DC

## 2016-05-05 NOTE — Clinical Social Work Maternal (Signed)
CLINICAL SOCIAL WORK MATERNAL/CHILD NOTE  Patient Details  Name: Sarah Mueller MRN: [id number] Date of Birth: 09-25-1976  Date:  05/05/2016  Clinical Social Worker Initiating Note:  Laurey Arrow Date/ Time Initiated:  05/05/16/0924     Child's Name:  Sarah Mueller   Legal Guardian:  Mother   Need for Interpreter:  None   Date of Referral:  05/04/16     Reason for Referral:  Behavioral Health Issues, including SI , Late or No Prenatal Care , Current Substance Use/Substance Use During Pregnancy    Referral Source:  Central Nursery   Address:  [address] [address]  Phone number:  [phone]   Household Members:  Self, Minor Children, Parents   Natural Supports (not living in the home):  Immediate Family, Spouse/significant other, Parent   Professional Supports: None   Employment: Full-time   Type of Work:     Education:  Database administrator Resources:  [employer]   Other Resources:  [employer], Physicist, medical    Cultural/Religious Considerations Which May Impact Care:  [employer], [name] is Engineer, manufacturing  Strengths:  Ability to meet basic needs , Home prepared for child , Pediatrician chosen    Risk Factors/Current Problems:  Substance Use , Mental Health Concerns    Cognitive State:  Alert , Linear Thinking , Goal Oriented    Mood/Affect:  Comfortable , Calm , Relaxed    CSW Assessment:  CSW met with [name] to complete an assessment for a consult for LPNC, hx of THC use, and hx of depression.  [name] was inviting, calm, and interested with speaking with CSW.  [name] introduced her room guest as [name]) and gave CSW permission to speak with [name] while FOB was visiting.  FOB did not engage with CSW, however, he was very attentive and appropriate with infant.  CSW inquired about [name]'s LPNC, and [name] disclosed that [name] and FOB were unsure for a while if they were going to terminate the pregnancy.  [name] stated that  once they made a final decision [name] was consistent with [name]'s prenatal care. Both parents appeared to be and bonded with the baby as evidence by gentle touching, smiling, and their affection towards the infant.  [name] denied any regrets with the infant.   CSW informed [name] of the hospital's drug screen policy, and informed [name] of the 2 screenings for the infant.  [name] was understanding and acknowledged she utilized marijuana throughout her pregnancy.  CSW informed [name] of the infant's positive UDS, and [name] stated she was not surprised.  CSW offered [name] SA resources and referrals and [name] declined. CSW informed [name] that CSW will make a report today to CPS for the infant's positive UDS. [name] understood.  CSW also inquired about [name]'s hx of depression.  [name] acknowledged she was dx with depression in [name]'s early 64s.  However, [name] has not taken any medication or attended therapy in over 10 years.  CSW inquired about [name]'s coping skills and [name] responded, "I just deals with it."  CSW offered [name] resources and referrals for behavioral health counseling and [name] declined.  [name] communicated she knows where to get if help is needed.  CSW also educated [name] about PPD. CSW informed [name] of possible supports and interventions to decrease PPD.  CSW also encouraged [name] to seek medical attention if needed for increased signs and symptoms for PPD. CSW also reviewed safe sleep, and SIDS. [name] was knowledgeable and asked  appropriate questions.  [name] communicated that she has a bassinet for the baby, and has read information regarding SIDS. CSW thanked [name] for her honesty, and willingness to meet with her.  [name] did not have any further questions, concerns, or needs at this  CSW Plan/Description:  No Further Intervention Required/No Barriers to Discharge, Patient/Family Education , Child Protective Service Report     Dimple Nanas, LCSW 05/05/2016, 9:28 AM

## 2016-05-05 NOTE — Discharge Summary (Signed)
OB Discharge Summary  Patient Name: Sarah Mueller DOB: 05/02/1976 MRN: [id number]  Date of admission: 05/03/2016 Delivering MD: Laurey Arrow [place]   Date of discharge: 05/05/2016  Admitting diagnosis: cpt [phone] - REPEAT CS Intrauterine pregnancy: [redacted]w[redacted]d     Secondary diagnosis:Active Problems:   Previous cesarean delivery, antepartum condition or complication   Status post repeat low transverse cesarean section  Additional problems:none     Discharge diagnosis: Term Pregnancy Delivered                                                                     Post partum procedures:none  Augmentation: n/a  Complications: None  Hospital course:  Sceduled C/S   40 y.o. yo [id number] at [redacted]w[redacted]d was admitted to the hospital 05/03/2016 for scheduled cesarean section with the following indication:Elective Repeat.  Membrane Rupture Time/Date: 12:28 PM ,05/03/2016   Patient delivered a Viable infant.05/03/2016  Details of operation can be found in separate operative note.  Pateint had an uncomplicated postpartum course.  She is ambulating, tolerating a regular diet, passing flatus, and urinating well. Patient is discharged home in stable condition on  05/05/2016          Physical exam  Filed Vitals:   05/03/16 2025 05/03/16 2300 05/04/16 0505 05/04/16 1750  BP: 118/80  115/77 122/83  Pulse: 65  69 83  Temp: 97.5 F (36.4 C) 97.9 F (36.6 C) 98 F (36.7 C) 98.7 F (37.1 C)  TempSrc: Oral Oral Oral Oral  Resp: 18 18 18 18   SpO2: 96% 96% 97% 100%   General: alert, cooperative and no distress Lochia: appropriate Uterine Fundus: firm Incision: Healing well with no significant drainage, No significant erythema, Dressing is clean, dry, and intact DVT Evaluation: No evidence of DVT seen on physical exam. Negative Homan's sign. No cords or calf tenderness. Labs: Lab Results  Component Value Date   WBC 9.8 05/04/2016   HGB 10.9* 05/04/2016   HCT 31.6* 05/04/2016   MCV 93.5 05/04/2016    PLT 207 05/04/2016   CMP Latest Ref Rng 04/01/2013  Glucose 70 - 99 mg/dL 102(H)  BUN 6 - 23 mg/dL 5(L)  Creatinine 0.50 - 1.10 mg/dL 0.64  Sodium 135 - 145 mEq/L 135  Potassium 3.5 - 5.1 mEq/L 3.4(L)  Chloride 96 - 112 mEq/L 101  CO2 19 - 32 mEq/L 24  Calcium 8.4 - 10.5 mg/dL 8.2(L)  Total Protein 6.0 - 8.3 g/dL 6.2  Total Bilirubin 0.3 - 1.2 mg/dL 0.4  Alkaline Phos 39 - 117 U/L 59  AST 0 - 37 U/L 39(H)  ALT 0 - 35 U/L 32    Discharge instruction: per After Visit Summary and "Baby and Me Booklet".  After Visit Meds:    Medication List    ASK your doctor about these medications        metroNIDAZOLE 500 MG tablet  Commonly known as:  FLAGYL  Take 1 tablet (500 mg total) by mouth 2 (two) times daily.     prenatal multivitamin Tabs tablet  Take 1 tablet by mouth daily at 12 noon.     ROLAIDS ANTACID ULTRA STRENGTH PO  Take 1 tablet by mouth daily as needed (for heartburn). Reported on 03/18/2016  Diet: routine diet  Activity: Advance as tolerated. Pelvic rest for 6 weeks.   Outpatient follow up:6 weeks Follow up Appt:No future appointments. Follow up visit: No Follow-up on file.  Postpartum contraception: Tubal Ligation  Newborn Data: Live born female  Birth Weight: 6 lb 14.6 oz (3135 g) APGAR: 10, 10  Baby Feeding: Breast Disposition:home with mother   05/05/2016 Koren Shiver, CNM

## 2016-05-05 NOTE — Anesthesia Postprocedure Evaluation (Signed)
Anesthesia Post Note  Patient: Sarah Mueller  Procedure(s) Performed: Procedure(s) (LRB): CESAREAN SECTION (N/A)  Patient location during evaluation: Mother Baby Anesthesia Type: Epidural Level of consciousness: awake and alert Pain management: pain level controlled Vital Signs Assessment: post-procedure vital signs reviewed and stable Respiratory status: spontaneous breathing Cardiovascular status: stable Postop Assessment: no headache, adequate PO intake, no backache, patient able to bend at knees, epidural receding and no signs of nausea or vomiting Anesthetic complications: no     Last Vitals:  Filed Vitals:   05/04/16 1750 05/05/16 0700  BP: 122/83 117/65  Pulse: 83 80  Temp: 37.1 C 36.9 C  Resp: 18 18    Last Pain:  Filed Vitals:   05/05/16 0716  PainSc: Asleep   Pain Goal:                 Ailene Ards

## 2016-05-05 NOTE — Addendum Note (Signed)
Addendum  created 05/05/16 0734 by Genevie Ann, CRNA   Modules edited: Clinical Notes   Clinical Notes:  File: [id number]

## 2016-05-06 LAB — TYPE AND SCREEN
ABO/RH(D): O POS
Antibody Screen: NEGATIVE
UNIT DIVISION: 0
UNIT DIVISION: 0

## 2016-05-08 ENCOUNTER — Encounter (HOSPITAL_COMMUNITY): Payer: Self-pay

## 2016-05-08 ENCOUNTER — Inpatient Hospital Stay (HOSPITAL_COMMUNITY)
Admission: AD | Admit: 2016-05-08 | Discharge: 2016-05-08 | Disposition: A | Discharge status: Home / Self Care | Payer: Medicaid Other | Source: Ambulatory Visit | Attending: Obstetrics and Gynecology | Admitting: Obstetrics and Gynecology

## 2016-05-08 ENCOUNTER — Telehealth: Payer: Self-pay | Admitting: General Practice

## 2016-05-08 DIAGNOSIS — Z87891 Personal history of nicotine dependence: Secondary | ICD-10-CM | POA: Insufficient documentation

## 2016-05-08 DIAGNOSIS — Z4889 Encounter for other specified surgical aftercare: Secondary | ICD-10-CM | POA: Insufficient documentation

## 2016-05-08 DIAGNOSIS — R52 Pain, unspecified: Secondary | ICD-10-CM | POA: Insufficient documentation

## 2016-05-08 DIAGNOSIS — L7682 Other postprocedural complications of skin and subcutaneous tissue: Secondary | ICD-10-CM

## 2016-05-08 DIAGNOSIS — Z8249 Family history of ischemic heart disease and other diseases of the circulatory system: Secondary | ICD-10-CM | POA: Diagnosis not present

## 2016-05-08 DIAGNOSIS — Z5189 Encounter for other specified aftercare: Secondary | ICD-10-CM

## 2016-05-08 NOTE — MAU Note (Signed)
Notice drainage on incision yesterday. Has been having on right side. Feels like it's on the outside of the incision

## 2016-05-08 NOTE — MAU Provider Note (Signed)
History     CSN: [id number]  Arrival date and time: 05/08/16 1958   First Provider Initiated Contact with Patient 05/08/16 2027      Chief Complaint  Patient presents with  . Drainage from Incision  . Incisional Pain   HPI Comments: Sarah Mueller is a 40 y.o. [id number] who is S/P c-section on 05/03/16. She has had incisional pain on the right since immediatly after the c-section. She reports that the pain is worse with bending and moving. She has started to be more active at home today. She rates her pain 3/10. She states that it is more irritating than painful. She has been sparingly taking the oxycodone at home. She has not been taking ibuprofen at home. She states that yesterday she noticed that her bandage seemed "wet", and she thinks there is some drainage coming from there.   Past Medical History  Diagnosis Date  . Pancreatitis 03/30/2013  . Infection     UTI  . Depression     fine now  . Chlamydia     Past Surgical History  Procedure Laterality Date  . Cesarean section  2005  . Dilation and curettage of uterus  ~ 2000  . Cholecystectomy N/A 03/31/2013    Procedure: LAPAROSCOPIC CHOLECYSTECTOMY WITH INTRAOPERATIVE CHOLANGIOGRAM;  Surgeon: Ralene Ok, MD;  Location: [facility];  Service: General;  Laterality: N/A;  . Induced abortion    . Cesarean section N/A 05/03/2016    Procedure: CESAREAN SECTION;  Surgeon: Truett Mainland, DO;  Location: [facility];  Service: Obstetrics;  Laterality: N/A;    Family History  Problem Relation Age of Onset  . Hypertension Mother   . Stroke Maternal Grandmother   . Stroke Maternal Grandfather   . Stroke Paternal Grandmother   . Stroke Paternal Grandfather   . Hypertension Paternal Grandfather     Social History  Substance Use Topics  . Smoking status: Former Smoker -- 0.33 packs/day for 15 years    Types: Cigarettes  . Smokeless tobacco: Never Used     Comment: quit early Jan  . Alcohol Use: No    Allergies: No Known  Allergies  Prescriptions prior to admission  Medication Sig Dispense Refill Last Dose  . acetaminophen (TYLENOL) 500 MG tablet Take 1,000 mg by mouth every 6 (six) hours as needed for mild pain.   05/08/2016 at Unknown time  . oxyCODONE 10 MG TABS Take 1 tablet (10 mg total) by mouth every 4 (four) hours as needed (pain scale > 7). 30 tablet 0 05/08/2016 at Unknown time  . ibuprofen (ADVIL,MOTRIN) 600 MG tablet Take 1 tablet (600 mg total) by mouth every 6 (six) hours. (Patient not taking: Reported on 05/08/2016) 30 tablet 0     Review of Systems  Constitutional: Negative for fever and chills.  Gastrointestinal: Negative for nausea, vomiting, abdominal pain, diarrhea and constipation.  Genitourinary: Negative for dysuria, urgency and frequency.   Physical Exam   Blood pressure 135/73, pulse 106, temperature 99.4 F (37.4 C), temperature source Oral, resp. rate 16, last menstrual period 08/04/2015, unknown if currently breastfeeding.  Physical Exam  Nursing note and vitals reviewed. Constitutional: She is oriented to person, place, and time. She appears well-developed and well-nourished. No distress.  HENT:  Head: Normocephalic.  Cardiovascular: Normal rate.   Respiratory: Effort normal.  GI: Soft. There is no tenderness. There is no rebound.  Genitourinary:  Honeycomb dressing removed. Some old drainage noted on the bandage, but no new active drainage seen.  Incision is clean/dry/intact. No erythema.   Neurological: She is alert and oriented to person, place, and time.  Skin: Skin is warm and dry.  Psychiatric: She has a normal mood and affect.    MAU Course  Procedures  MDM  Assessment and Plan   1. Pain at surgical incision   2. Visit for wound check    DC home Comfort measures reviewed  RX: none, patient advised to take ibuprofen OTC 600mg  every 6 hours on schedule for 2-3 days  Return to MAU as needed FU with OB as planned  Follow-up Information    Follow up with  [facility].   Specialty:  Obstetrics and Gynecology   Why:  As scheduled   Contact information:   [place] [phone]        Mathis Bud 05/08/2016, 8:28 PM

## 2016-05-08 NOTE — Telephone Encounter (Signed)
Patient called and left message stating she went home on Monday from a c/s and is having issues with her incision. Called patient, no answer- left message to call us back at the office

## 2016-05-08 NOTE — Discharge Instructions (Signed)
Cesarean Delivery, Care After  Refer to this sheet in the next few weeks. These instructions provide you with information on caring for yourself after your procedure. Your health care provider may also give you specific instructions. Your treatment has been planned according to current medical practices, but problems sometimes occur. Call your health care provider if you have any problems or questions after you go home.  HOME CARE INSTRUCTIONS   Only take over-the-counter or prescription medications as directed by your health care provider.   Do not drink alcohol, especially if you are breastfeeding or taking medication to relieve pain.   Do not chew or smoke tobacco.   Continue to use good perineal care. Good perineal care includes:    Wiping your perineum from front to back.    Keeping your perineum clean.   Check your surgical cut (incision) daily for increased redness, drainage, swelling, or separation of skin.   Clean your incision gently with soap and water every day, and then pat it dry. If your health care provider says it is okay, leave the incision uncovered. Use a bandage (dressing) if the incision is draining fluid or appears irritated. If the adhesive strips across the incision do not fall off within 7 days, carefully peel them off.   Hug a pillow when coughing or sneezing until your incision is healed. This helps to relieve pain.   Do not use tampons or douche until your health care provider says it is okay.   Shower, wash your hair, and take tub baths as directed by your health care provider.   Wear a well-fitting bra that provides breast support.   Limit wearing support panties or control-top hose.   Drink enough fluids to keep your urine clear or pale yellow.   Eat high-fiber foods such as whole grain cereals and breads, brown rice, beans, and fresh fruits and vegetables every day. These foods may help prevent or relieve constipation.   Resume activities such as climbing stairs,  driving, lifting, exercising, or traveling as directed by your health care provider.   Talk to your health care provider about resuming sexual activities. This is dependent upon your risk of infection, your rate of healing, and your comfort and desire to resume sexual activity.   Try to have someone help you with your household activities and your newborn for at least a few days after you leave the hospital.   Rest as much as possible. Try to rest or take a nap when your newborn is sleeping.   Increase your activities gradually.   Keep all of your scheduled postpartum appointments. It is very important to keep your scheduled follow-up appointments. At these appointments, your health care provider will be checking to make sure that you are healing physically and emotionally.  SEEK MEDICAL CARE IF:    You are passing large clots from your vagina. Save any clots to show your health care provider.   You have a foul smelling discharge from your vagina.   You have trouble urinating.   You are urinating frequently.   You have pain when you urinate.   You have a change in your bowel movements.   You have increasing redness, pain, or swelling near your incision.   You have pus draining from your incision.   Your incision is separating.   You have painful, hard, or reddened breasts.   You have a severe headache.   You have blurred vision or see spots.   You feel sad   or depressed.   You have thoughts of hurting yourself or your newborn.   You have questions about your care, the care of your newborn, or medications.   You are dizzy or light-headed.   You have a rash.   You have pain, redness, or swelling at the site of the removed intravenous access (IV) tube.   You have nausea or vomiting.   You stopped breastfeeding and have not had a menstrual period within 12 weeks of stopping.   You are not breastfeeding and have not had a menstrual period within 12 weeks of delivery.   You have a fever.  SEEK  IMMEDIATE MEDICAL CARE IF:   You have persistent pain.   You have chest pain.   You have shortness of breath.   You faint.   You have leg pain.   You have stomach pain.   Your vaginal bleeding saturates 2 or more sanitary pads in 1 hour.  MAKE SURE YOU:    Understand these instructions.   Will watch your condition.   Will get help right away if you are not doing well or get worse.     This information is not intended to replace advice given to you by your health care provider. Make sure you discuss any questions you have with your health care provider.     Document Released: 07/12/2002 Document Revised: 11/10/2014 Document Reviewed: 06/16/2012  Elsevier Interactive Patient Education 2016 [employer]

## 2016-05-14 NOTE — Telephone Encounter (Signed)
Per chart review, pt presented to MAU for evaluation of her concerns several hours after leaving voice mail message on 05/08/16. Call back not indicated at this time.

## 2016-05-15 ENCOUNTER — Encounter (HOSPITAL_COMMUNITY): Payer: Self-pay | Admitting: *Deleted

## 2016-05-21 ENCOUNTER — Ambulatory Visit (INDEPENDENT_AMBULATORY_CARE_PROVIDER_SITE_OTHER): Payer: Self-pay | Admitting: Clinical

## 2016-05-21 DIAGNOSIS — F3163 Bipolar disorder, current episode mixed, severe, without psychotic features: Secondary | ICD-10-CM

## 2016-05-21 NOTE — Progress Notes (Addendum)
  ASSESSMENT: Pt currently experiencing Bipolar disorder, current episode mixed, severe, without psychotic features. Pt needs to see psychiatry, as well as f/u with [facility].  Pt would benefit from psychoeducation and brief therapeutic interventions today. Pt may benefit from additional social support. Stage of Change: contemplative  PLAN: 1. F/U with behavioral health clinician in 2 weeks, or earlier, by phone; in person as needed 2. Psychiatric Medications: none 3. Behavioral recommendations:   -[place] clinic in morning 05-22-16 -Call [facility] Ephraim Reichel at [phone] to let her know appointment has been set and any medications prescribed by psychiatry asap after visit - Read educational material regarding coping with postpartum depression and anxiety - Consider Feel[facility] After Birth support group Tuesdays 10am-11am at [facility]  4. Referral: [facility] walk-in clinic, 05-22-16  SUBJECTIVE: Pt. referred by Jorje Guild, NP, for symptoms of anxiety and depression Pt. reports the following symptoms/concerns: Pt states that she is only sleeping up to 2 hours at a time, for the past three weeks, pressured speech, increase in "OCD activity", easily irritated,  family members have expressed to her concern about change in behavior. Pt also states depressive symptoms daily for past two weeks, no appetite, she feels like "something isn't right", but has had no SI or HI. Pt was treated for depressive symptoms 16 years prior, put on Depakote for 6 months, but that it "didn't work"; she has been self-medicating with "a glass of wine and a bowl", but has not used any substances in past three weeks, and unsure how to cope; states there is a family history of [place] "hospitalizations/being institutionalized", in other family members, but uncertain diagnosis.  Duration of problem: Three weeks Severity: severe   OBJECTIVE: Orientation & Cognition: Oriented x3. Thought processes normal and appropriate to  situation. Mood: expansive Affect: appropriate Appearance: appropriate Risk of harm to self or others: no known risk of harm to self or others Substance use: none current Assessments administered: PHQ9: 23/ GAD7: 21  Diagnosis: Bipolar disorder, current episode mixed, severe, without psychotic features CPT Code: F31.63  -------------------------------------------- Other(s) present in the room: none  Time spent with patient in exam room: 30 minutes, 9:30am-10:00am  Depression screen PHQ 2/9 05/21/2016  Decreased Interest 2  Down, Depressed, Hopeless 3  PHQ - 2 Score 5  Altered sleeping 3  Tired, decreased energy 3  Change in appetite 3  Feeling bad or failure about yourself  3  Trouble concentrating 3  Moving slowly or fidgety/restless 3  Suicidal thoughts 0  PHQ-9 Score 23    GAD 7 : Generalized Anxiety Score 05/21/2016  Nervous, Anxious, on Edge 3  Control/stop worrying 3  Worry too much - different things 3  Trouble relaxing 3  Restless 3  Easily annoyed or irritable 3  Afraid - awful might happen 3  Total GAD 7 Score 21

## 2016-06-13 ENCOUNTER — Encounter: Payer: Self-pay | Admitting: Family Medicine

## 2016-06-13 ENCOUNTER — Encounter: Payer: Self-pay | Admitting: Obstetrics & Gynecology

## 2016-06-13 ENCOUNTER — Ambulatory Visit (INDEPENDENT_AMBULATORY_CARE_PROVIDER_SITE_OTHER): Payer: Medicaid Other | Admitting: Obstetrics & Gynecology

## 2016-06-13 MED ORDER — IBUPROFEN 600 MG PO TABS: 600.0000 mg | ORAL_TABLET | Freq: Four times a day (QID) | ORAL | 0 refills | Status: DC

## 2016-06-13 NOTE — Progress Notes (Signed)
Subjective:     Sarah Mueller is a 40 y.o. female who presents for a postpartum visit. She is 6 weeks postpartum following a low cervical transverse Cesarean section. I have fully reviewed the prenatal and intrapartum course. The delivery was at 22 gestational weeks. Outcome: repeat cesarean section, low transverse incision. Anesthesia: spinal. Postpartum course has been unremarkable. Baby's course has been unremarkable. Baby is feeding by bottle - Similac Advance. Bleeding changing a maxi pad every 2 hours. Bowel function is normal. Bladder function is normal. Patient is not sexually active. Contraception method is tubal ligation. Postpartum depression screening: positive.  The following portions of the patient's history were reviewed and updated as appropriate: allergies, current medications, past family history, past medical history, past social history, past surgical history and problem list.  Review of Systems Behavioral/Psych: positive for depression and improving, has f/u at [facility]   Objective:    There were no vitals taken for this visit.  General:  alert, cooperative and no distress   Breasts:  inspection negative, no nipple discharge or bleeding, no masses or nodularity palpable  Lungs:    Heart:     Abdomen:  soft not incision ok   Vulva:  not evaluated  Vagina: not evaluated     Corpus: not examined  Adnexa:  not evaluated  Rectal Exam: Not performed.        Assessment:     normal postpartum exam. Pap smear not done at today's visit.  PP depression on Cymbalta Plan:    1. Contraception: tubal ligation 2. F/u at [facility] 3. Follow up as needed.   4. Ibuprofen for bleeding and cramps  Woodroe Mode, MD 06/13/2016

## 2016-10-01 ENCOUNTER — Inpatient Hospital Stay (HOSPITAL_COMMUNITY)
Admission: AD | Admit: 2016-10-01 | Discharge: 2016-10-01 | Disposition: A | Discharge status: Home / Self Care | Payer: Medicaid Other | Source: Ambulatory Visit | Attending: Obstetrics & Gynecology | Admitting: Obstetrics & Gynecology

## 2016-10-01 ENCOUNTER — Encounter (HOSPITAL_COMMUNITY): Payer: Self-pay | Admitting: *Deleted

## 2016-10-01 DIAGNOSIS — S0990XA Unspecified injury of head, initial encounter: Secondary | ICD-10-CM

## 2016-10-01 DIAGNOSIS — F329 Major depressive disorder, single episode, unspecified: Secondary | ICD-10-CM | POA: Insufficient documentation

## 2016-10-01 DIAGNOSIS — Z9889 Other specified postprocedural states: Secondary | ICD-10-CM | POA: Diagnosis not present

## 2016-10-01 DIAGNOSIS — Z9049 Acquired absence of other specified parts of digestive tract: Secondary | ICD-10-CM | POA: Insufficient documentation

## 2016-10-01 DIAGNOSIS — K859 Acute pancreatitis without necrosis or infection, unspecified: Secondary | ICD-10-CM | POA: Diagnosis not present
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
## 2017-01-22 ENCOUNTER — Emergency Department (HOSPITAL_COMMUNITY)
Admission: EM | Admit: 2017-01-22 | Discharge: 2017-01-22 | Disposition: A | Discharge status: Home / Self Care | Payer: Medicaid Other | Attending: Emergency Medicine | Admitting: Emergency Medicine

## 2017-01-22 ENCOUNTER — Encounter (HOSPITAL_COMMUNITY): Payer: Self-pay

## 2017-01-22 DIAGNOSIS — Z79899 Other long term (current) drug therapy: Secondary | ICD-10-CM | POA: Insufficient documentation

## 2017-01-22 DIAGNOSIS — Z87891 Personal history of nicotine dependence: Secondary | ICD-10-CM | POA: Insufficient documentation

## 2017-01-22 DIAGNOSIS — L245 Irritant contact dermatitis due to other chemical products: Secondary | ICD-10-CM | POA: Diagnosis not present

## 2017-01-22 DIAGNOSIS — R21 Rash and other nonspecific skin eruption: Secondary | ICD-10-CM | POA: Diagnosis present

## 2017-01-22 MED ORDER — DEXAMETHASONE SODIUM PHOSPHATE 10 MG/ML IJ SOLN
10.0000 mg | Freq: Once | INTRAMUSCULAR | Status: AC
  Administered 2017-01-22: 10 mg via INTRAMUSCULAR
  Filled 2017-01-22: qty 1

## 2017-01-22 MED ORDER — TRIAMCINOLONE ACETONIDE 0.5 % EX OINT: 1.0000 "application " | TOPICAL_OINTMENT | Freq: Two times a day (BID) | CUTANEOUS | 0 refills | Status: DC

## 2017-01-22 NOTE — Discharge Instructions (Signed)
Use Triamcinolone cream on affected area several times a day Avoid allergen as much as possible Take benadryl 25mg  for itching

## 2017-01-22 NOTE — ED Triage Notes (Signed)
Pt has dry rash noted to top of R hand

## 2017-01-22 NOTE — ED Provider Notes (Signed)
[place] DEPT Provider Note   CSN: [id number] Arrival date & time: 01/22/17  1761  By signing my name below, I, Higinio Plan, attest that this documentation has been prepared under the direction and in the presence of Janetta Hora, PA-C . Electronically Signed: Higinio Plan, Scribe. 01/22/2017. 9:41 AM.  History   Chief Complaint Chief Complaint  Patient presents with  . Rash    rash noted to the top of her hands that started two weeks ago on the top of R hand and has spread to the top of L hand    The history is provided by the patient. No language interpreter was used.   HPI Comments: Sarah Mueller is a 41 y.o. female who presents to the Emergency Department complaining of gradually worsening, pruritic rash to her bilateral hands that began ~2 weeks ago. Pt reports her rash initially began as "small bumps" on her right hand before spreading to her left hand. She states she recently started using a new dish detergent at work and believes this could have caused her symptoms. She notes she tried wearing gloves while at work last night but reports they "made her hands sweat and itch more," which is why she visited the ED today. Pt states she has not applied any ointments or taken any medication for her symptoms. She denies any drainage and hx of underlying skin disorders or DM.    Past Medical History:  Diagnosis Date  . Chlamydia   . Depression    fine now  . Infection    UTI  . Pancreatitis 03/30/2013    Patient Active Problem List   Diagnosis Date Noted  . Status post repeat low transverse cesarean section 05/03/2016  . History of acute pancreatitis 03/30/2013  . Obesity, unspecified 03/30/2013  . Tobacco abuse 03/30/2013    Past Surgical History:  Procedure Laterality Date  . CESAREAN SECTION  2005  . CESAREAN SECTION N/A 05/03/2016   Procedure: CESAREAN SECTION;  Surgeon: Truett Mainland, DO;  Location: [place];  Service: Obstetrics;  Laterality: N/A;  .  CHOLECYSTECTOMY N/A 03/31/2013   Procedure: LAPAROSCOPIC CHOLECYSTECTOMY WITH INTRAOPERATIVE CHOLANGIOGRAM;  Surgeon: Ralene Ok, MD;  Location: [place];  Service: General;  Laterality: N/A;  . DILATION AND CURETTAGE OF UTERUS  ~ 2000  . INDUCED ABORTION      OB History    Gravida Para Term Preterm AB Living   3 2 2   1 2    SAB TAB Ectopic Multiple Live Births   0 1   0 2     Home Medications    Prior to Admission medications   Medication Sig Start Date End Date Taking? Authorizing Provider  acetaminophen (TYLENOL) 500 MG tablet Take 1,000 mg by mouth every 6 (six) hours as needed for mild pain.    Historical Provider, MD  DULoxetine (CYMBALTA) 30 MG capsule Take 30 mg by mouth daily.    Historical Provider, MD  ibuprofen (ADVIL,MOTRIN) 600 MG tablet Take 1 tablet (600 mg total) by mouth every 6 (six) hours. 06/13/16   Woodroe Mode, MD  oxyCODONE 10 MG TABS Take 1 tablet (10 mg total) by mouth every 4 (four) hours as needed (pain scale > 7). Patient not taking: Reported on 06/13/2016 05/05/16   Keitha Butte, CNM   Family History Family History  Problem Relation Age of Onset  . Hypertension Mother   . Stroke Maternal Grandmother   . Stroke Maternal Grandfather   . Stroke  Paternal Grandmother   . Stroke Paternal Grandfather   . Hypertension Paternal Grandfather    Social History Social History  Substance Use Topics  . Smoking status: Former Smoker    Packs/day: 0.33    Years: 15.00    Types: Cigarettes  . Smokeless tobacco: Never Used     Comment: quit early Jan  . Alcohol use No   Allergies   Patient has no known allergies.  Review of Systems Review of Systems  Constitutional: Negative for fever.  Skin: Positive for rash.   Physical Exam Updated Vital Signs BP 135/81 (BP Location: Left Arm)   Pulse 98   Temp 98.9 F (37.2 C) (Oral)   Resp 16   Ht 5\' 7"  (1.702 m)   Wt 268 lb (121.6 kg)   SpO2 98%   BMI 41.97 kg/m   Physical Exam  Constitutional: She  is oriented to person, place, and time. She appears well-developed and well-nourished.  HENT:  Head: Normocephalic.  Eyes: EOM are normal.  Neck: Normal range of motion.  Pulmonary/Chest: Effort normal.  Abdominal: She exhibits no distension.  Musculoskeletal: Normal range of motion.  Neurological: She is alert and oriented to person, place, and time.  Skin: Rash noted.  Excoriated, dry, scaly rash on dorsal aspect of right hand with erythematous papules surrounding it. Erythematous papules on left hand as well.   Psychiatric: She has a normal mood and affect.  Nursing note and vitals reviewed.  ED Treatments / Results  DIAGNOSTIC STUDIES:  Oxygen Saturation is 98% on RA, normal by my interpretation.    COORDINATION OF CARE:  9:38 AM Discussed treatment plan with pt at bedside and pt agreed to plan.  Labs (all labs ordered are listed, but only abnormal results are displayed) Labs Reviewed - No data to display  EKG  EKG Interpretation None       Radiology No results found.  Procedures Procedures (including critical care time)  Medications Ordered in ED Medications - No data to display  Initial Impression / Assessment and Plan / ED Course  I have reviewed the triage vital signs and the nursing notes.  Pertinent labs & imaging results that were available during my care of the patient were reviewed by me and considered in my medical decision making (see chart for details).  Patient with contact dermatitis. Instructed to avoid offending agent and to use unscented soaps, lotions, and detergents. Will treat with IM Decadron, Triamcinolone, Benadryl for itching.  No signs of secondary infection. Return precautions discussed.  I personally performed the services described in this documentation, which was scribed in my presence. The recorded information has been reviewed and is accurate.   Final Clinical Impressions(s) / ED Diagnoses   Final diagnoses:  Irritant contact  dermatitis due to other chemical products    New Prescriptions New Prescriptions   No medications on file     Recardo Evangelist, PA-C 01/22/17 [place], MD 01/23/17 [phone]

## 2017-06-07 ENCOUNTER — Emergency Department (HOSPITAL_COMMUNITY)
Admission: EM | Admit: 2017-06-07 | Discharge: 2017-06-07 | Disposition: A | Discharge status: Home / Self Care | Payer: Medicaid Other | Attending: Emergency Medicine | Admitting: Emergency Medicine

## 2017-06-07 ENCOUNTER — Emergency Department (HOSPITAL_COMMUNITY): Payer: Medicaid Other

## 2017-06-07 ENCOUNTER — Encounter (HOSPITAL_COMMUNITY): Payer: Self-pay | Admitting: *Deleted

## 2017-06-07 DIAGNOSIS — Z87891 Personal history of nicotine dependence: Secondary | ICD-10-CM | POA: Insufficient documentation

## 2017-06-07 DIAGNOSIS — Z79899 Other long term (current) drug therapy: Secondary | ICD-10-CM | POA: Insufficient documentation

## 2017-06-07 DIAGNOSIS — M25562 Pain in left knee: Secondary | ICD-10-CM

## 2017-06-07 NOTE — Discharge Instructions (Signed)
Alternate 600 mg of ibuprofen and [phone] mg of Tylenol every 3 hours as needed for pain. Do not exceed 4000 mg of Tylenol daily. Apply ice or heat to the affected area for comfort. Try to avoid activities that aggravate her symptoms. Take hot showers or baths and do some gentle stretching of the knee during this time. I have attached knee exercises that he may find helpful. Follow up with your primary care physician for reevaluation and possible referral to physical therapy or the injection. Return to the ED immediately if any concerning signs or symptoms develop such as redness, swelling, severe pain, or pulselessness to the left leg.

## 2017-06-07 NOTE — ED Provider Notes (Signed)
[place] DEPT Provider Note   CSN: [id number] Arrival date & time: 06/07/17  1116  By signing my name below, I, Ephriam Jenkins, attest that this documentation has been prepared under the direction and in the presence of [facility] PA-C.  Electronically Signed: Ephriam Jenkins, ED Scribe. 06/07/17. 12:29 PM.  History   Chief Complaint Chief Complaint  Patient presents with  . Knee Pain    HPI HPI Comments: Sarah Mueller is a 41 y.o. female who presents to the Emergency Department complaining of constant, dull left knee pain s/p a possible injury that occurred approximately one month ago. The pain is non-radiating, predominantly on the lateral aspect of the left knee. Pt reports that she was moving furniture approximately one month ago and states that she may have twisted it incorrectly. She also notes that two weeks ago she fell down some stairs and fell on her buttocks. She denies hitting her knee during this incident. No head injury, no LOC. Pt also stands for extended periods of time at her job which may have exacerbated her pain. She reports that the pain is exacerbated during ambulation. Pt has taken Tylenol without significant relief of pain. No Hx of similar pain. Pt is a current smoker ~1ppd. She is not taking birth control. No Hx of DVT. No fever or chills. No numbness, tingling, weakness.   The history is provided by the patient. No language interpreter was used.    Past Medical History:  Diagnosis Date  . Chlamydia   . Depression    fine now  . Infection    UTI  . Pancreatitis 03/30/2013    Patient Active Problem List   Diagnosis Date Noted  . Status post repeat low transverse cesarean section 05/03/2016  . History of acute pancreatitis 03/30/2013  . Obesity, unspecified 03/30/2013  . Tobacco abuse 03/30/2013    Past Surgical History:  Procedure Laterality Date  . CESAREAN SECTION  2005  . CESAREAN SECTION N/A 05/03/2016   Procedure: CESAREAN SECTION;  Surgeon:  Truett Mainland, DO;  Location: [place];  Service: Obstetrics;  Laterality: N/A;  . CHOLECYSTECTOMY N/A 03/31/2013   Procedure: LAPAROSCOPIC CHOLECYSTECTOMY WITH INTRAOPERATIVE CHOLANGIOGRAM;  Surgeon: Ralene Ok, MD;  Location: [place];  Service: General;  Laterality: N/A;  . DILATION AND CURETTAGE OF UTERUS  ~ 2000  . INDUCED ABORTION      OB History    Gravida Para Term Preterm AB Living   3 2 2   1 2    SAB TAB Ectopic Multiple Live Births   0 1   0 2       Home Medications    Prior to Admission medications   Medication Sig Start Date End Date Taking? Authorizing Provider  acetaminophen (TYLENOL) 500 MG tablet Take 1,000 mg by mouth every 6 (six) hours as needed for mild pain.    [provider]  DULoxetine (CYMBALTA) 30 MG capsule Take 30 mg by mouth daily.    [provider]  ibuprofen (ADVIL,MOTRIN) 600 MG tablet Take 1 tablet (600 mg total) by mouth every 6 (six) hours. 06/13/16   Woodroe Mode, MD  triamcinolone ointment (KENALOG) 0.5 % Apply 1 application topically 2 (two) times daily. 01/22/17   Recardo Evangelist, PA-C    Family History Family History  Problem Relation Age of Onset  . Hypertension Mother   . Stroke Maternal Grandmother   . Stroke Maternal Grandfather   . Stroke Paternal Grandmother   . Stroke  Paternal Grandfather   . Hypertension Paternal Grandfather     Social History Social History  Substance Use Topics  . Smoking status: Former Smoker    Packs/day: 0.33    Years: 15.00    Types: Cigarettes  . Smokeless tobacco: Never Used     Comment: quit early Jan  . Alcohol use No     Allergies   Patient has no known allergies.   Review of Systems Review of Systems  Constitutional: Negative for chills and fever.  Musculoskeletal: Positive for arthralgias. Negative for gait problem and joint swelling.  Skin: Negative for wound.  Neurological: Negative for weakness and numbness.    Physical Exam Updated Vital  Signs BP 129/90 (BP Location: Left Arm)   Pulse (!) 115   Temp 98.3 F (36.8 C) (Oral)   Resp 20   SpO2 96%   Physical Exam  Constitutional: She appears well-developed and well-nourished. No distress.  Sleeping comfortably in bed, easily arousable.   HENT:  Head: Normocephalic and atraumatic.  Eyes: Conjunctivae are normal. Right eye exhibits no discharge. Left eye exhibits no discharge.  Neck: No JVD present. No tracheal deviation present.  Cardiovascular: Normal rate and intact distal pulses.   2+ DP/PT pulses bilaterally, Homans absent bilaterally  Pulmonary/Chest: Effort normal.  Abdominal: She exhibits no distension.  Musculoskeletal: She exhibits no edema.       Right knee: Normal.       Left knee: She exhibits normal range of motion, no swelling, no effusion, no ecchymosis, no deformity, no laceration, no erythema, normal alignment, no LCL laxity, normal patellar mobility, no bony tenderness, normal meniscus and no MCL laxity. Tenderness found. LCL tenderness noted. No medial joint line, no lateral joint line, no MCL and no patellar tendon tenderness noted.  No varus or valgus deformity of the left knee. Negative anterior/posterior drawer tests. 5/5 strength of bilateral lower extremity major muscle groups  Neurological: She is alert.  Fluent speech, no facial droop, sensation intact to soft touch of bilateral lower extremities. Antalgic gait, but able to heel walk and toe walk without difficulty.   Skin: Skin is warm and dry. Capillary refill takes less than 2 seconds. No erythema.  Psychiatric: She has a normal mood and affect. Her behavior is normal.  Nursing note and vitals reviewed.   ED Treatments / Results  DIAGNOSTIC STUDIES: Oxygen Saturation is 96% on RA, normal by my interpretation.  COORDINATION OF CARE: 12:44 PM-Discussed treatment plan with pt at bedside and pt agreed to plan.   Labs (all labs ordered are listed, but only abnormal results are  displayed) Labs Reviewed - No data to display  EKG  EKG Interpretation None       Radiology Dg Knee Complete 4 Views Left  Result Date: 06/07/2017 CLINICAL DATA:  Left lateral knee pain and swelling. EXAM: LEFT KNEE - COMPLETE 4+ VIEW COMPARISON:  None. FINDINGS: Mild osteophytosis in the knee compartments. No significant joint space narrowing. No large joint effusion. Knee is located without a fracture. IMPRESSION: Mild osteoarthritis.  No acute abnormality. Electronically Signed   By: Markus Daft M.D.   On: 06/07/2017 13:32    Procedures Procedures (including critical care time)  Medications Ordered in ED Medications - No data to display   Initial Impression / Assessment and Plan / ED Course  I have reviewed the triage vital signs and the nursing notes.  Pertinent labs & imaging results that were available during my care of the patient were reviewed by  me and considered in my medical decision making (see chart for details).     Patient with left knee pain for one month status post possible injury after moving furniture. Afebrile, vital signs are stable, neurovascularly intact. Homans absent, doubt DVT. Pain primarily localized to the LCL. Radiograph reviewed by me show no evidence of fracture dislocation, posterior bilateral is, or significant effusion. She does have mild osteoarthritis with no acute abnormality. In the absence of swelling or erythema with normal range of motion, doubt septic joint or gout. RICE Therapy indicated and discussed. She stable for discharge with knee sleeve.  discussed indications for return to the ED. She will follow-up with primary care for reevaluation and possible referral to physical therapy in 1 week. Answered all questions and no complaints prior to discharge. Pt verbalized understanding of and agreement with plan and is safe for discharge home at this time.  Final Clinical Impressions(s) / ED Diagnoses   Final diagnoses:  Acute pain of left knee     New Prescriptions Discharge Medication List as of 06/07/2017  2:12 PM    I personally performed the services described in this documentation, which was scribed in my presence. The recorded information has been reviewed and is accurate.     Renita Papa, PA-C 06/07/17 1508    Quintella Reichert, MD 06/07/17 [phone]

## 2017-06-07 NOTE — ED Triage Notes (Signed)
Pt reports injuring left knee one month ago and still has pain and swelling.

## 2017-06-07 NOTE — ED Notes (Signed)
Declined W/C at D/C and was escorted to lobby by RN. 

## 2018-04-14 ENCOUNTER — Emergency Department (HOSPITAL_COMMUNITY)
Admission: EM | Admit: 2018-04-14 | Discharge: 2018-04-14 | Disposition: A | Discharge status: Home / Self Care | Payer: Self-pay | Attending: Emergency Medicine | Admitting: Emergency Medicine

## 2018-04-14 ENCOUNTER — Encounter (HOSPITAL_COMMUNITY): Payer: Self-pay | Admitting: Emergency Medicine

## 2018-04-14 ENCOUNTER — Other Ambulatory Visit: Payer: Self-pay

## 2018-04-14 ENCOUNTER — Emergency Department (HOSPITAL_COMMUNITY): Payer: Self-pay

## 2018-04-14 DIAGNOSIS — Z79899 Other long term (current) drug therapy: Secondary | ICD-10-CM | POA: Insufficient documentation

## 2018-04-14 DIAGNOSIS — G8929 Other chronic pain: Secondary | ICD-10-CM

## 2018-04-14 DIAGNOSIS — M25562 Pain in left knee: Secondary | ICD-10-CM | POA: Insufficient documentation

## 2018-04-14 DIAGNOSIS — Z87891 Personal history of nicotine dependence: Secondary | ICD-10-CM | POA: Insufficient documentation

## 2018-04-14 MED ORDER — PREDNISONE 10 MG (21) PO TBPK: ORAL_TABLET | Freq: Every day | ORAL | 0 refills | Status: DC

## 2018-04-14 NOTE — ED Provider Notes (Signed)
[facility] EMERGENCY DEPARTMENT Provider Note   CSN: [id number] Arrival date & time: 04/14/18  1325     History   Chief Complaint Chief Complaint  Patient presents with  . Knee Pain    HPI Sarah Mueller is a 42 y.o. female.  HPI   Patient is a 42 year old female who presents emergency department today complaining of left knee pain that has been ongoing for the last year.  She denies any changes in her pain today.  Pain has been constant for the last year and severe in nature.  She states that sometimes her knee will catch and give out on her.  Denies significant swelling to the knee.  No redness or warmth to the knee.  No numbness or weakness to the legs.  No swelling erythema or tenderness to the calves.  No recent admissions to the hospital.  No recent surgeries or prolonged periods of immobilization.  No hemoptysis.  No chest pain or shortness of breath.  No fevers.  No history of DVT/PE.  She has never seen an orthopedist.  She was seen in the ED for similar symptoms about 1 year ago and has not followed up with orthopedics.  States she had a knee sleeve at that time which helped relieve her symptoms however she lost it.  Is requesting referral to the orthopedic doctor now that she has insurance.   Past Medical History:  Diagnosis Date  . Chlamydia   . Depression    fine now  . Infection    UTI  . Pancreatitis 03/30/2013    Patient Active Problem List   Diagnosis Date Noted  . Status post repeat low transverse cesarean section 05/03/2016  . History of acute pancreatitis 03/30/2013  . Obesity, unspecified 03/30/2013  . Tobacco abuse 03/30/2013    Past Surgical History:  Procedure Laterality Date  . CESAREAN SECTION  2005  . CESAREAN SECTION N/A 05/03/2016   Procedure: CESAREAN SECTION;  Surgeon: Truett Mainland, DO;  Location: [facility];  Service: Obstetrics;  Laterality: N/A;  . CHOLECYSTECTOMY N/A 03/31/2013   Procedure: LAPAROSCOPIC  CHOLECYSTECTOMY WITH INTRAOPERATIVE CHOLANGIOGRAM;  Surgeon: Ralene Ok, MD;  Location: [facility];  Service: General;  Laterality: N/A;  . DILATION AND CURETTAGE OF UTERUS  ~ 2000  . INDUCED ABORTION       OB History    Gravida  3   Para  2   Term  2   Preterm      AB  1   Living  2     SAB  0   TAB  1   Ectopic      Multiple  0   Live Births  2            Home Medications    Prior to Admission medications   Medication Sig Start Date End Date Taking? Authorizing Provider  acetaminophen (TYLENOL) 500 MG tablet Take 1,000 mg by mouth every 6 (six) hours as needed for mild pain.    [provider]  DULoxetine (CYMBALTA) 30 MG capsule Take 30 mg by mouth daily.    [provider]  ibuprofen (ADVIL,MOTRIN) 600 MG tablet Take 1 tablet (600 mg total) by mouth every 6 (six) hours. 06/13/16   Woodroe Mode, MD  predniSONE (STERAPRED UNI-PAK 21 TAB) 10 MG (21) TBPK tablet Take by mouth daily. Take 6 tabs by mouth daily  for 2 days, then 5 tabs for 2 days, then 4 tabs  for 2 days, then 3 tabs for 2 days, 2 tabs for 2 days, then 1 tab by mouth daily for 2 days 04/14/18   Ronne Stefanski S, PA-C  triamcinolone ointment (KENALOG) 0.5 % Apply 1 application topically 2 (two) times daily. 01/22/17   Recardo Evangelist, PA-C    Family History Family History  Problem Relation Age of Onset  . Hypertension Mother   . Stroke Maternal Grandmother   . Stroke Maternal Grandfather   . Stroke Paternal Grandmother   . Stroke Paternal Grandfather   . Hypertension Paternal Grandfather     Social History Social History   Tobacco Use  . Smoking status: Former Smoker    Packs/day: 0.33    Years: 15.00    Pack years: 4.95    Types: Cigarettes  . Smokeless tobacco: Never Used  . Tobacco comment: quit early Jan  Substance Use Topics  . Alcohol use: No    Alcohol/week: 1.2 oz    Types: 1 Glasses of wine, 1 Shots of liquor per week  . Drug use: No     Allergies    Patient has no known allergies.   Review of Systems Review of Systems  Constitutional: Negative for fever.  Respiratory: Negative for shortness of breath.   Cardiovascular: Negative for chest pain.  Musculoskeletal:       Left knee pain  Skin: Negative for color change and wound.  Neurological: Negative for weakness and numbness.     Physical Exam Updated Vital Signs BP 117/90 (BP Location: Right Arm)   Pulse 99   Temp 97.8 F (36.6 C) (Oral)   Resp 16   SpO2 99%   Physical Exam  Constitutional: She appears well-developed and well-nourished. No distress.  HENT:  Head: Normocephalic and atraumatic.  Eyes: Conjunctivae are normal.  Neck: Neck supple.  Cardiovascular: Normal rate.  Pulmonary/Chest: Effort normal.  Musculoskeletal: Normal range of motion.  Tenderness to the medial joint line of the left knee.  Pain worsens with varus stress of the knee.  No tenderness to the posterior aspect of the knee.  No significant effusion noted on exam.  No erythema or warmth to the joint.  Patient able to flex knee to 90 degrees.  No joint laxity.  Patient able to ambulate with steady gait.  5/5 strength of bilateral lower extremities.  Normal sensation.  Distal pulses intact.  Neurological: She is alert.  Skin: Skin is warm and dry. Capillary refill takes less than 2 seconds.  Psychiatric: She has a normal mood and affect.  Nursing note and vitals reviewed.  ED Treatments / Results  Labs (all labs ordered are listed, but only abnormal results are displayed) Labs Reviewed - No data to display  EKG None  Radiology Dg Knee Complete 4 Views Left  Result Date: 04/14/2018 CLINICAL DATA:  LEFT knee pain and decreased range of motion for the past year. No injury. EXAM: LEFT KNEE - COMPLETE 4+ VIEW COMPARISON:  Plain film of the LEFT knee dated 06/07/2017. FINDINGS: Again noted is minimal degenerative spurring at the medial and patellofemoral compartments. No evidence of advanced  degenerative osteoarthritis. No joint space narrowing. No acute or suspicious osseous finding. No fracture line or displaced fracture fragment. No appreciable joint effusion and adjacent soft tissues are unremarkable. IMPRESSION: 1. No acute findings. 2. Minimal degenerative spurring. Electronically Signed   By: Franki Cabot M.D.   On: 04/14/2018 14:13    Procedures Procedures (including critical care time)  Medications Ordered in ED  Medications - No data to display   Initial Impression / Assessment and Plan / ED Course  I have reviewed the triage vital signs and the nursing notes.  Pertinent labs & imaging results that were available during my care of the patient were reviewed by me and considered in my medical decision making (see chart for details).    Final Clinical Impressions(s) / ED Diagnoses   Final diagnoses:  Chronic pain of left knee   Patient presenting with left knee pain that has been ongoing for the last year.  Pain unchanged today.  No significant effusion seen on x-ray.  There is some spurring to the bones medially into the patellofemoral compartments.  Patient tender to the medial joint line and has worsening pain with varus stress of the left knee.  Suspect symptoms due to osteoarthritis although could also be due to a meniscal tear.  Will give patient referral to orthopedic doctor.  Will also give knee sleeve.  Advised Tylenol and Motrin at home for her symptoms as well as rice protocol. Will also give steroid taper to help reduce inflammation.  Strict return precautions discussed.  Patient understands plan reasons to return immediately to the ED.  ED Discharge Orders        Ordered    predniSONE (STERAPRED UNI-PAK 21 TAB) 10 MG (21) TBPK tablet  Daily     04/14/18 1514       Rodney Booze, PA-C 04/14/18 1515    Tanna Furry, MD 04/15/18 [phone]

## 2018-04-14 NOTE — Discharge Instructions (Signed)
You may alternate taking Tylenol and Ibuprofen as needed for pain control. You may take 400-600 mg of ibuprofen every 6 hours and [phone] mg of Tylenol every 6 hours. Do not exceed 4000 mg of Tylenol daily as this can lead to liver damage. Also, make sure to take Ibuprofen with meals as it can cause an upset stomach. Do not take other NSAIDs while taking Ibuprofen such as (Aleve, Naprosyn, Aspirin, Celebrex, etc) and do not take more than the prescribed dose as this can lead to ulcers and bleeding in your GI tract. You may use warm and cold compresses to help with your symptoms.   You were also given a prescription for a steroid taper.  Please take all medications as prescribed.  Please follow up with your primary doctor within the next 7-10 days for re-evaluation and further treatment of your symptoms.   Please return to the ER sooner if you have any new or worsening symptoms.

## 2018-04-14 NOTE — ED Triage Notes (Signed)
Patient complains of left knee pain for the last year. Patient states she was seen for the same last year, was given a knee sleeve, but states she did not receive a diagnosis or referral and is unsure of what to do next or how to care for the pain. States she had taken tylenol and NSAIDs without relief. Pain makes ambulation difficult.

## 2018-04-17 ENCOUNTER — Encounter (HOSPITAL_COMMUNITY): Payer: Self-pay | Admitting: Emergency Medicine

## 2018-04-17 ENCOUNTER — Emergency Department (HOSPITAL_COMMUNITY): Payer: Self-pay

## 2018-04-17 ENCOUNTER — Other Ambulatory Visit: Payer: Self-pay

## 2018-04-17 ENCOUNTER — Emergency Department (HOSPITAL_COMMUNITY)
Admission: EM | Admit: 2018-04-17 | Discharge: 2018-04-17 | Disposition: A | Discharge status: Home / Self Care | Payer: Self-pay | Attending: Emergency Medicine | Admitting: Emergency Medicine

## 2018-04-17 DIAGNOSIS — Z79899 Other long term (current) drug therapy: Secondary | ICD-10-CM | POA: Insufficient documentation

## 2018-04-17 DIAGNOSIS — F17210 Nicotine dependence, cigarettes, uncomplicated: Secondary | ICD-10-CM | POA: Insufficient documentation

## 2018-04-17 DIAGNOSIS — R600 Localized edema: Secondary | ICD-10-CM | POA: Insufficient documentation

## 2018-04-17 LAB — CBC
HCT: 38.3 % (ref 36.0–46.0)
Hemoglobin: 12 g/dL (ref 12.0–15.0)
MCH: 29.1 pg (ref 26.0–34.0)
MCHC: 31.3 g/dL (ref 30.0–36.0)
MCV: 93 fL (ref 78.0–100.0)
PLATELETS: 260 10*3/uL (ref 150–400)
RBC: 4.12 MIL/uL (ref 3.87–5.11)
RDW: 13.2 % (ref 11.5–15.5)
WBC: 8.5 10*3/uL (ref 4.0–10.5)

## 2018-04-17 LAB — COMPREHENSIVE METABOLIC PANEL
ALT: 16 U/L (ref 14–54)
ANION GAP: 7 (ref 5–15)
AST: 19 U/L (ref 15–41)
Albumin: 3.4 g/dL — ABNORMAL LOW (ref 3.5–5.0)
Alkaline Phosphatase: 99 U/L (ref 38–126)
BUN: 8 mg/dL (ref 6–20)
CHLORIDE: 106 mmol/L (ref 101–111)
CO2: 28 mmol/L (ref 22–32)
Calcium: 8.6 mg/dL — ABNORMAL LOW (ref 8.9–10.3)
Creatinine, Ser: 0.62 mg/dL (ref 0.44–1.00)
GFR calc non Af Amer: >60 mL/min (ref >60)
Glucose, Bld: 103 mg/dL — ABNORMAL HIGH (ref 65–99)
POTASSIUM: 3.4 mmol/L — AB (ref 3.5–5.1)
SODIUM: 141 mmol/L (ref 135–145)
Total Bilirubin: 0.3 mg/dL (ref 0.3–1.2)
Total Protein: 6.1 g/dL — ABNORMAL LOW (ref 6.5–8.1)

## 2018-04-17 MED ORDER — POTASSIUM CHLORIDE CRYS ER 20 MEQ PO TBCR
40.0000 meq | EXTENDED_RELEASE_TABLET | Freq: Once | ORAL | Status: AC
  Administered 2018-04-17: 40 meq via ORAL
  Filled 2018-04-17: qty 2

## 2018-04-17 NOTE — ED Notes (Signed)
Patient transported to X-ray 

## 2018-04-17 NOTE — ED Triage Notes (Signed)
C/o bilateral lower extremity edema x 1 week.  Denies sob or chest pain.

## 2018-04-17 NOTE — ED Provider Notes (Signed)
[facility] EMERGENCY DEPARTMENT Provider Note   CSN: [id number] Arrival date & time: 04/17/18  3790     History   Chief Complaint Chief Complaint  Patient presents with  . lower extremity edema    HPI Sarah Mueller is a 42 y.o. female.  Patient c/o bilateral lower leg, ankle and foot edema for the past week. Hx intermittent swelling in past, but usually would go down eventually. Has worked 12 days straight and been on feet more than normal. Denies sob. No orthopnea/pnd. No hx chf. No hx liver or renal disease. No recent dietary change or increased salt. No change in meds. No fever or chills. No hx dvt or pe. No immobility, trauma, travel or surgery.   The history is provided by the patient.    Past Medical History:  Diagnosis Date  . Chlamydia   . Depression    fine now  . Infection    UTI  . Pancreatitis 03/30/2013    Patient Active Problem List   Diagnosis Date Noted  . Status post repeat low transverse cesarean section 05/03/2016  . History of acute pancreatitis 03/30/2013  . Obesity, unspecified 03/30/2013  . Tobacco abuse 03/30/2013    Past Surgical History:  Procedure Laterality Date  . CESAREAN SECTION  2005  . CESAREAN SECTION N/A 05/03/2016   Procedure: CESAREAN SECTION;  Surgeon: Truett Mainland, DO;  Location: [facility];  Service: Obstetrics;  Laterality: N/A;  . CHOLECYSTECTOMY N/A 03/31/2013   Procedure: LAPAROSCOPIC CHOLECYSTECTOMY WITH INTRAOPERATIVE CHOLANGIOGRAM;  Surgeon: Ralene Ok, MD;  Location: [facility];  Service: General;  Laterality: N/A;  . DILATION AND CURETTAGE OF UTERUS  ~ 2000  . INDUCED ABORTION       OB History    Gravida  3   Para  2   Term  2   Preterm      AB  1   Living  2     SAB  0   TAB  1   Ectopic      Multiple  0   Live Births  2            Home Medications    Prior to Admission medications   Medication Sig Start Date End Date Taking? Authorizing Provider    acetaminophen (TYLENOL) 500 MG tablet Take 1,000 mg by mouth every 6 (six) hours as needed for mild pain.    [provider]  DULoxetine (CYMBALTA) 30 MG capsule Take 30 mg by mouth daily.    [provider]  ibuprofen (ADVIL,MOTRIN) 600 MG tablet Take 1 tablet (600 mg total) by mouth every 6 (six) hours. 06/13/16   Woodroe Mode, MD  predniSONE (STERAPRED UNI-PAK 21 TAB) 10 MG (21) TBPK tablet Take by mouth daily. Take 6 tabs by mouth daily  for 2 days, then 5 tabs for 2 days, then 4 tabs for 2 days, then 3 tabs for 2 days, 2 tabs for 2 days, then 1 tab by mouth daily for 2 days 04/14/18   Couture, Cortni S, PA-C  triamcinolone ointment (KENALOG) 0.5 % Apply 1 application topically 2 (two) times daily. 01/22/17   Recardo Evangelist, PA-C    Family History Family History  Problem Relation Age of Onset  . Hypertension Mother   . Stroke Maternal Grandmother   . Stroke Maternal Grandfather   . Stroke Paternal Grandmother   . Stroke Paternal Grandfather   . Hypertension Paternal Grandfather  Social History Social History   Tobacco Use  . Smoking status: Current Every Day Smoker    Packs/day: 0.33    Years: 15.00    Pack years: 4.95    Types: Cigarettes  . Smokeless tobacco: Never Used  . Tobacco comment: quit early Jan  Substance Use Topics  . Alcohol use: No    Alcohol/week: 1.2 oz    Types: 1 Glasses of wine, 1 Shots of liquor per week  . Drug use: No     Allergies   Patient has no known allergies.   Review of Systems Review of Systems  Constitutional: Negative for fever.  Respiratory: Negative for shortness of breath.   Cardiovascular: Positive for leg swelling. Negative for chest pain.  Gastrointestinal: Negative for abdominal distention.  Skin: Negative for rash.     Physical Exam Updated Vital Signs BP 121/80 (BP Location: Right Arm)   Pulse (!) 108   Temp 97.8 F (36.6 C) (Oral)   Resp 18   LMP 03/30/2018   SpO2 99%   Physical Exam   Constitutional: She appears well-developed and well-nourished.  HENT:  Head: Atraumatic.  Eyes: Conjunctivae are normal. No scleral icterus.  Neck: Neck supple. No tracheal deviation present.  Cardiovascular: Normal rate, regular rhythm, normal heart sounds and intact distal pulses. Exam reveals no gallop and no friction rub.  No murmur heard. Pulmonary/Chest: Effort normal and breath sounds normal. No respiratory distress.  Abdominal: Soft. Normal appearance. She exhibits no distension. There is no tenderness.  Musculoskeletal: She exhibits edema.  Symmetric bilateral lower leg/ankle/foot edema. Distal pulses palp. No calf pain/tenderness.   Neurological: She is alert.  Skin: Skin is warm and dry. No rash noted. She is not diaphoretic.  Psychiatric: She has a normal mood and affect.  Nursing note and vitals reviewed.    ED Treatments / Results  Labs (all labs ordered are listed, but only abnormal results are displayed) Results for orders placed or performed during the hospital encounter of 04/17/18  CBC  Result Value Ref Range   WBC 8.5 4.0 - 10.5 K/uL   RBC 4.12 3.87 - 5.11 MIL/uL   Hemoglobin 12.0 12.0 - 15.0 g/dL   HCT 38.3 36.0 - 46.0 %   MCV 93.0 78.0 - 100.0 fL   MCH 29.1 26.0 - 34.0 pg   MCHC 31.3 30.0 - 36.0 g/dL   RDW 13.2 11.5 - 15.5 %   Platelets 260 150 - 400 K/uL  Comprehensive metabolic panel  Result Value Ref Range   Sodium 141 135 - 145 mmol/L   Potassium 3.4 (L) 3.5 - 5.1 mmol/L   Chloride 106 101 - 111 mmol/L   CO2 28 22 - 32 mmol/L   Glucose, Bld 103 (H) 65 - 99 mg/dL   BUN 8 6 - 20 mg/dL   Creatinine, Ser 0.62 0.44 - 1.00 mg/dL   Calcium 8.6 (L) 8.9 - 10.3 mg/dL   Total Protein 6.1 (L) 6.5 - 8.1 g/dL   Albumin 3.4 (L) 3.5 - 5.0 g/dL   AST 19 15 - 41 U/L   ALT 16 14 - 54 U/L   Alkaline Phosphatase 99 38 - 126 U/L   Total Bilirubin 0.3 0.3 - 1.2 mg/dL   GFR calc non Af Amer >60 >60 mL/min   GFR calc Af Amer >60 >60 mL/min   Anion gap 7 5 - 15    Dg Chest 2 View  Result Date: 04/17/2018 CLINICAL DATA:  Bilateral lower extremity edema for 1  week. EXAM: CHEST - 2 VIEW COMPARISON:  03/30/2013. FINDINGS: Trachea is midline. Heart size stable. Linear scarring in the lingula. Lungs are clear. No pleural fluid. IMPRESSION: No acute findings. Electronically Signed   By: Lorin Picket M.D.   On: 04/17/2018 08:22   Dg Knee Complete 4 Views Left  Result Date: 04/14/2018 CLINICAL DATA:  LEFT knee pain and decreased range of motion for the past year. No injury. EXAM: LEFT KNEE - COMPLETE 4+ VIEW COMPARISON:  Plain film of the LEFT knee dated 06/07/2017. FINDINGS: Again noted is minimal degenerative spurring at the medial and patellofemoral compartments. No evidence of advanced degenerative osteoarthritis. No joint space narrowing. No acute or suspicious osseous finding. No fracture line or displaced fracture fragment. No appreciable joint effusion and adjacent soft tissues are unremarkable. IMPRESSION: 1. No acute findings. 2. Minimal degenerative spurring. Electronically Signed   By: Franki Cabot M.D.   On: 04/14/2018 14:13    EKG EKG Interpretation  Date/Time:  Saturday April 17 2018 07:06:40 EDT Ventricular Rate:  97 PR Interval:  130 QRS Duration: 88 QT Interval:  396 QTC Calculation: 502 R Axis:   67 Text Interpretation:  Normal sinus rhythm Prolonged QT Abnormal ECG No old tracing to compare Confirmed by Delora Fuel ([id number]) on 04/17/2018 7:22:57 AM   Radiology No results found.  Procedures Procedures (including critical care time)  Medications Ordered in ED Medications - No data to display   Initial Impression / Assessment and Plan / ED Course  I have reviewed the triage vital signs and the nursing notes.  Pertinent labs & imaging results that were available during my care of the patient were reviewed by me and considered in my medical decision making (see chart for details).  Labs sent. Cxr.  Reviewed nursing notes and  prior charts for additional history.   Legs elevated.   cxr reviewed - no edema.   Labs reviewed - k low, kcl po.   Recheck resting comfortably, no dyspnea. Hr 92, rr 16.  Pt currently appears stable for d/c.      Final Clinical Impressions(s) / ED Diagnoses   Final diagnoses:  None    ED Discharge Orders    None       Lajean Saver, MD 04/17/18 [phone]

## 2018-04-17 NOTE — Discharge Instructions (Addendum)
It was our pleasure to provide your ER care today - we hope that you feel better.  From today's lab tests, your potassium level is slightly low (3.4) - eat plenty of fruits and vegetables, and follow up with primary care doctor.  For ankle/foot swelling, elevate legs as much as possible (above level of heart), limit salt intake, consider compression hose, and follow up with primary care doctor  in the next 1-2 weeks for recheck.  Return to ER if worse, trouble breathing, other concern.

## 2018-07-31 ENCOUNTER — Emergency Department (HOSPITAL_COMMUNITY)
Admission: EM | Admit: 2018-07-31 | Discharge: 2018-08-01 | Disposition: A | Discharge status: Home / Self Care | Payer: Self-pay | Attending: Emergency Medicine | Admitting: Emergency Medicine

## 2018-07-31 ENCOUNTER — Encounter (HOSPITAL_COMMUNITY): Payer: Self-pay | Admitting: *Deleted

## 2018-07-31 ENCOUNTER — Other Ambulatory Visit: Payer: Self-pay

## 2018-07-31 DIAGNOSIS — E876 Hypokalemia: Secondary | ICD-10-CM | POA: Insufficient documentation

## 2018-07-31 DIAGNOSIS — R609 Edema, unspecified: Secondary | ICD-10-CM

## 2018-07-31 DIAGNOSIS — R2241 Localized swelling, mass and lump, right lower limb: Secondary | ICD-10-CM | POA: Insufficient documentation

## 2018-07-31 DIAGNOSIS — R2242 Localized swelling, mass and lump, left lower limb: Secondary | ICD-10-CM | POA: Insufficient documentation

## 2018-07-31 DIAGNOSIS — F17210 Nicotine dependence, cigarettes, uncomplicated: Secondary | ICD-10-CM | POA: Insufficient documentation

## 2018-07-31 DIAGNOSIS — Z79899 Other long term (current) drug therapy: Secondary | ICD-10-CM | POA: Insufficient documentation

## 2018-07-31 NOTE — ED Triage Notes (Addendum)
Pt presents to ED with c/o bilateral lower leg swelling x 2 weeks. Pt sts she did have similar symptoms in the past from working on her feet all day, but usually swelling goes away immediately, unlike this time. Both legs are red, swollen, with palpable distal pulses. Denies shortness of breath or chest pain.

## 2018-07-31 NOTE — ED Provider Notes (Signed)
[facility] DEPT Provider Note: Georgena Spurling, MD, FACEP  CSN: [id number] MRN: [id number] ARRIVAL: 07/31/18 at 2041 ROOM: [place]  Leg Swelling (bilateral)   HISTORY OF PRESENT ILLNESS  07/31/18 11:41 PM Sarah Mueller is a 42 y.o. female with a 2-week history of bilateral lower extremity edema.  She has a history of edema in the past but never this severe or persistent.  The edema is worse when she has been standing for prolonged periods of time and she has been standing at work for a long period today.  There is associated pain which she rates as an 8 out of 10, worse with movement or palpation.  She denies chest pain or shortness of breath.    Past Medical History:  Diagnosis Date  . Chlamydia   . Depression    fine now  . Infection    UTI  . Pancreatitis 03/30/2013    Past Surgical History:  Procedure Laterality Date  . CESAREAN SECTION  2005  . CESAREAN SECTION N/A 05/03/2016   Procedure: CESAREAN SECTION;  Surgeon: Truett Mainland, DO;  Location: [facility];  Service: Obstetrics;  Laterality: N/A;  . CHOLECYSTECTOMY N/A 03/31/2013   Procedure: LAPAROSCOPIC CHOLECYSTECTOMY WITH INTRAOPERATIVE CHOLANGIOGRAM;  Surgeon: Ralene Ok, MD;  Location: [facility];  Service: General;  Laterality: N/A;  . DILATION AND CURETTAGE OF UTERUS  ~ 2000  . INDUCED ABORTION      Family History  Problem Relation Age of Onset  . Hypertension Mother   . Stroke Maternal Grandmother   . Stroke Maternal Grandfather   . Stroke Paternal Grandmother   . Stroke Paternal Grandfather   . Hypertension Paternal Grandfather     Social History   Tobacco Use  . Smoking status: Current Every Day Smoker    Packs/day: 0.33    Years: 15.00    Pack years: 4.95    Types: Cigarettes  . Smokeless tobacco: Never Used  . Tobacco comment: quit early Jan  Substance Use Topics  . Alcohol use: No    Alcohol/week: 2.0 standard drinks    Types: 1 Glasses of wine, 1 Shots of  liquor per week  . Drug use: No    Prior to Admission medications   Medication Sig Start Date End Date Taking? Authorizing Provider  acetaminophen (TYLENOL) 500 MG tablet Take 1,000 mg by mouth every 6 (six) hours as needed for mild pain.    [provider]  DULoxetine (CYMBALTA) 30 MG capsule Take 30 mg by mouth daily.    [provider]  ibuprofen (ADVIL,MOTRIN) 600 MG tablet Take 1 tablet (600 mg total) by mouth every 6 (six) hours. 06/13/16   Woodroe Mode, MD  predniSONE (STERAPRED UNI-PAK 21 TAB) 10 MG (21) TBPK tablet Take by mouth daily. Take 6 tabs by mouth daily  for 2 days, then 5 tabs for 2 days, then 4 tabs for 2 days, then 3 tabs for 2 days, 2 tabs for 2 days, then 1 tab by mouth daily for 2 days 04/14/18   Couture, Cortni S, PA-C  triamcinolone ointment (KENALOG) 0.5 % Apply 1 application topically 2 (two) times daily. 01/22/17   Recardo Evangelist, PA-C    Allergies Patient has no known allergies.   REVIEW OF SYSTEMS  Negative except as noted here or in the History of Present Illness.   PHYSICAL EXAMINATION  Initial Vital Signs Blood pressure 128/74, pulse 90, temperature 99.2 F (37.3 C), temperature source Oral,  resp. rate (!) 21, last menstrual period 07/31/2018, SpO2 97 %.  Examination General: Well-developed, well-nourished female in no acute distress; appearance consistent with age of record HENT: normocephalic; atraumatic Eyes: pupils equal, round and reactive to light; extraocular muscles intact Neck: supple Heart: regular rate and rhythm Lungs: clear to auscultation bilaterally Abdomen: soft; nondistended; nontender; no masses or hepatosplenomegaly; bowel sounds present Extremities: No deformity; full range of motion; pulses normal; 2+ pitting edema of lower legs; no erythema or warmth Neurologic: Awake, alert and oriented; motor function intact in all extremities and symmetric; no facial droop Skin: Warm and dry Psychiatric: Normal mood  and affect   RESULTS  Summary of this visit's results, reviewed by myself:   EKG Interpretation  Date/Time:    Ventricular Rate:    PR Interval:    QRS Duration:   QT Interval:    QTC Calculation:   R Axis:     Text Interpretation:        Laboratory Studies: Results for orders placed or performed during the hospital encounter of 07/31/18 (from the past 24 hour(s))  CBC with Differential/Platelet     Status: Abnormal   Collection Time: 08/01/18 12:40 AM  Result Value Ref Range   WBC 9.0 4.0 - 10.5 K/uL   RBC 3.34 (L) 3.87 - 5.11 MIL/uL   Hemoglobin 10.4 (L) 12.0 - 15.0 g/dL   HCT 32.0 (L) 36.0 - 46.0 %   MCV 95.8 78.0 - 100.0 fL   MCH 31.1 26.0 - 34.0 pg   MCHC 32.5 30.0 - 36.0 g/dL   RDW 15.4 11.5 - 15.5 %   Platelets 261 150 - 400 K/uL   Neutrophils Relative % 55 %   Neutro Abs 5.0 1.7 - 7.7 K/uL   Lymphocytes Relative 37 %   Lymphs Abs 3.3 0.7 - 4.0 K/uL   Monocytes Relative 4 %   Monocytes Absolute 0.3 0.1 - 1.0 K/uL   Eosinophils Relative 4 %   Eosinophils Absolute 0.3 0.0 - 0.7 K/uL   Basophils Relative 0 %   Basophils Absolute 0.0 0.0 - 0.1 K/uL  Basic metabolic panel     Status: Abnormal   Collection Time: 08/01/18 12:40 AM  Result Value Ref Range   Sodium 141 135 - 145 mmol/L   Potassium 3.2 (L) 3.5 - 5.1 mmol/L   Chloride 108 98 - 111 mmol/L   CO2 26 22 - 32 mmol/L   Glucose, Bld 101 (H) 70 - 99 mg/dL   BUN 13 6 - 20 mg/dL   Creatinine, Ser 0.63 0.44 - 1.00 mg/dL   Calcium 8.3 (L) 8.9 - 10.3 mg/dL   GFR calc non Af Amer >60 >60 mL/min   GFR calc Af Amer >60 >60 mL/min   Anion gap 7 5 - 15  Brain natriuretic peptide     Status: None   Collection Time: 08/01/18 12:40 AM  Result Value Ref Range   B Natriuretic Peptide 53.6 0.0 - 100.0 pg/mL  D-dimer, quantitative (not at [facility])     Status: Abnormal   Collection Time: 08/01/18 12:40 AM  Result Value Ref Range   D-Dimer, Quant 0.64 (H) 0.00 - 0.50 ug/mL-FEU   Imaging Studies: No results  found.  ED COURSE and MDM  Nursing notes and initial vitals signs, including pulse oximetry, reviewed.  Vitals:   07/31/18 2048 07/31/18 2330 08/01/18 0040  BP: 136/80 128/74 130/80  Pulse: (!) 103 90 86  Resp: 16 (!) 21 (!) 26  Temp: 99.2 F (  37.3 C)    TempSrc: Oral    SpO2: 99% 97% 96%   2:35 AM We will have patient return for Doppler studies later this morning to evaluate for DVT.  PROCEDURES    ED DIAGNOSES     ICD-10-CM   1. Peripheral edema R60.9   2. Hypokalemia E87.6        Calahan Pak, Jenny Reichmann, MD 08/01/18 [phone]

## 2018-08-01 ENCOUNTER — Ambulatory Visit (HOSPITAL_COMMUNITY)
Admission: RE | Admit: 2018-08-01 | Discharge: 2018-08-01 | Disposition: A | Discharge status: Home / Self Care | Payer: Self-pay | Source: Ambulatory Visit | Attending: Emergency Medicine | Admitting: Emergency Medicine

## 2018-08-01 ENCOUNTER — Emergency Department (HOSPITAL_COMMUNITY)
Admission: EM | Admit: 2018-08-01 | Discharge: 2018-08-01 | Disposition: A | Discharge status: Home / Self Care | Payer: Self-pay | Attending: Emergency Medicine | Admitting: Emergency Medicine

## 2018-08-01 DIAGNOSIS — Z0489 Encounter for examination and observation for other specified reasons: Secondary | ICD-10-CM | POA: Insufficient documentation

## 2018-08-01 DIAGNOSIS — Z5321 Procedure and treatment not carried out due to patient leaving prior to being seen by health care provider: Secondary | ICD-10-CM | POA: Insufficient documentation

## 2018-08-01 DIAGNOSIS — M7989 Other specified soft tissue disorders: Secondary | ICD-10-CM | POA: Insufficient documentation

## 2018-08-01 DIAGNOSIS — R609 Edema, unspecified: Secondary | ICD-10-CM

## 2018-08-01 LAB — BASIC METABOLIC PANEL
Anion gap: 7 (ref 5–15)
BUN: 13 mg/dL (ref 6–20)
CHLORIDE: 108 mmol/L (ref 98–111)
CO2: 26 mmol/L (ref 22–32)
Calcium: 8.3 mg/dL — ABNORMAL LOW (ref 8.9–10.3)
Creatinine, Ser: 0.63 mg/dL (ref 0.44–1.00)
GFR calc Af Amer: >60 mL/min (ref >60)
GLUCOSE: 101 mg/dL — AB (ref 70–99)
POTASSIUM: 3.2 mmol/L — AB (ref 3.5–5.1)
SODIUM: 141 mmol/L (ref 135–145)

## 2018-08-01 LAB — CBC WITH DIFFERENTIAL/PLATELET
BASOS ABS: 0 10*3/uL (ref 0.0–0.1)
Basophils Relative: 0 %
EOS PCT: 4 %
Eosinophils Absolute: 0.3 10*3/uL (ref 0.0–0.7)
HCT: 32 % — ABNORMAL LOW (ref 36.0–46.0)
HEMOGLOBIN: 10.4 g/dL — AB (ref 12.0–15.0)
LYMPHS ABS: 3.3 10*3/uL (ref 0.7–4.0)
LYMPHS PCT: 37 %
MCH: 31.1 pg (ref 26.0–34.0)
MCHC: 32.5 g/dL (ref 30.0–36.0)
MCV: 95.8 fL (ref 78.0–100.0)
Monocytes Absolute: 0.3 10*3/uL (ref 0.1–1.0)
Monocytes Relative: 4 %
NEUTROS PCT: 55 %
Neutro Abs: 5 10*3/uL (ref 1.7–7.7)
PLATELETS: 261 10*3/uL (ref 150–400)
RBC: 3.34 MIL/uL — AB (ref 3.87–5.11)
RDW: 15.4 % (ref 11.5–15.5)
WBC: 9 10*3/uL (ref 4.0–10.5)

## 2018-08-01 LAB — BRAIN NATRIURETIC PEPTIDE: B NATRIURETIC PEPTIDE 5: 53.6 pg/mL (ref 0.0–100.0)

## 2018-08-01 LAB — D-DIMER, QUANTITATIVE: D-Dimer, Quant: 0.64 ug/mL-FEU — ABNORMAL HIGH (ref 0.00–0.50)

## 2018-08-01 MED ORDER — POTASSIUM CHLORIDE CRYS ER 20 MEQ PO TBCR: 20.0000 meq | EXTENDED_RELEASE_TABLET | Freq: Every day | ORAL | 0 refills | Status: AC

## 2018-08-01 MED ORDER — POTASSIUM CHLORIDE CRYS ER 20 MEQ PO TBCR
40.0000 meq | EXTENDED_RELEASE_TABLET | Freq: Once | ORAL | Status: AC
  Administered 2018-08-01: 40 meq via ORAL
  Filled 2018-08-01: qty 2

## 2018-08-01 MED ORDER — ENOXAPARIN SODIUM 120 MG/0.8ML ~~LOC~~ SOLN
115.0000 mg | Freq: Once | SUBCUTANEOUS | Status: AC
  Administered 2018-08-01: 115 mg via SUBCUTANEOUS
  Filled 2018-08-01: qty 0.77

## 2018-08-01 NOTE — Progress Notes (Signed)
*  Preliminary Results* Bilateral lower extremity venous duplex completed. Bilateral lower extremities are negative for deep vein thrombosis. There is no evidence of Baker's cyst bilaterally.  Incidental finding: there is a heterogenous area of the right groin measuring 3.6cm, suggestive of possible prominent inguinal lymph node.  08/01/2018 10:14 AM Maudry Mayhew, MHA, RVT, RDCS, RDMS

## 2018-08-01 NOTE — ED Triage Notes (Signed)
Patient states that she was sent here from [facility] for a vascular study.

## 2019-03-31 ENCOUNTER — Emergency Department (HOSPITAL_COMMUNITY): Payer: Self-pay

## 2019-03-31 ENCOUNTER — Emergency Department (HOSPITAL_COMMUNITY)
Admission: EM | Admit: 2019-03-31 | Discharge: 2019-03-31 | Disposition: A | Discharge status: Home / Self Care | Payer: Self-pay | Attending: Emergency Medicine | Admitting: Emergency Medicine

## 2019-03-31 ENCOUNTER — Other Ambulatory Visit: Payer: Self-pay

## 2019-03-31 DIAGNOSIS — S40022A Contusion of left upper arm, initial encounter: Secondary | ICD-10-CM | POA: Insufficient documentation

## 2019-03-31 DIAGNOSIS — R0781 Pleurodynia: Secondary | ICD-10-CM

## 2019-03-31 DIAGNOSIS — T07XXXA Unspecified multiple injuries, initial encounter: Secondary | ICD-10-CM

## 2019-03-31 DIAGNOSIS — Y999 Unspecified external cause status: Secondary | ICD-10-CM | POA: Insufficient documentation

## 2019-03-31 DIAGNOSIS — Y939 Activity, unspecified: Secondary | ICD-10-CM | POA: Insufficient documentation

## 2019-03-31 DIAGNOSIS — F17210 Nicotine dependence, cigarettes, uncomplicated: Secondary | ICD-10-CM | POA: Insufficient documentation

## 2019-03-31 DIAGNOSIS — S8011XA Contusion of right lower leg, initial encounter: Secondary | ICD-10-CM | POA: Insufficient documentation

## 2019-03-31 DIAGNOSIS — R0789 Other chest pain: Secondary | ICD-10-CM | POA: Insufficient documentation

## 2019-03-31 DIAGNOSIS — S8012XA Contusion of left lower leg, initial encounter: Secondary | ICD-10-CM | POA: Insufficient documentation

## 2019-03-31 DIAGNOSIS — W109XXA Fall (on) (from) unspecified stairs and steps, initial encounter: Secondary | ICD-10-CM | POA: Insufficient documentation

## 2019-03-31 DIAGNOSIS — Y929 Unspecified place or not applicable: Secondary | ICD-10-CM | POA: Insufficient documentation

## 2019-03-31 MED ORDER — HYDROCODONE-ACETAMINOPHEN 5-325 MG PO TABS: 1.0000 | ORAL_TABLET | Freq: Four times a day (QID) | ORAL | 0 refills | Status: AC | PRN
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
